# Patient Record
Sex: Male | Born: 1945 | Race: Black or African American | Hispanic: No | Marital: Married | Smoking: Never smoker
Health system: Southern US, Community
[De-identification: ages and names within clinical notes are randomized; demographics above are authoritative.]

## PROBLEM LIST (undated history)

## (undated) DIAGNOSIS — I10 Essential (primary) hypertension: Secondary | ICD-10-CM

## (undated) DIAGNOSIS — I4891 Unspecified atrial fibrillation: Secondary | ICD-10-CM

## (undated) DIAGNOSIS — K922 Gastrointestinal hemorrhage, unspecified: Secondary | ICD-10-CM

## (undated) DIAGNOSIS — N189 Chronic kidney disease, unspecified: Secondary | ICD-10-CM

## (undated) DIAGNOSIS — E079 Disorder of thyroid, unspecified: Secondary | ICD-10-CM

## (undated) HISTORY — DX: Chronic kidney disease, unspecified: N18.9

## (undated) HISTORY — DX: Essential (primary) hypertension: I10

## (undated) HISTORY — PX: CARDIAC DEFIBRILLATOR PLACEMENT: SHX171

## (undated) HISTORY — DX: Gastrointestinal hemorrhage, unspecified: K92.2

## (undated) HISTORY — DX: Disorder of thyroid, unspecified: E07.9

## (undated) HISTORY — DX: Unspecified atrial fibrillation: I48.91

---

## 2008-09-28 ENCOUNTER — Emergency Department: Payer: Self-pay | Admitting: Emergency Medicine

## 2010-03-16 ENCOUNTER — Ambulatory Visit: Payer: Self-pay | Admitting: Family Medicine

## 2010-05-29 ENCOUNTER — Ambulatory Visit: Payer: Self-pay | Admitting: Urology

## 2011-11-16 ENCOUNTER — Other Ambulatory Visit: Payer: Self-pay | Admitting: Family Medicine

## 2011-11-16 LAB — PROTIME-INR
INR: 2
Prothrombin Time: 22.8 secs — ABNORMAL HIGH (ref 11.5–14.7)

## 2012-01-31 ENCOUNTER — Ambulatory Visit: Payer: Self-pay | Admitting: Cardiology

## 2012-01-31 LAB — BASIC METABOLIC PANEL
Anion Gap: 8 (ref 7–16)
BUN: 45 mg/dL — ABNORMAL HIGH (ref 7–18)
Calcium, Total: 9.2 mg/dL (ref 8.5–10.1)
Chloride: 98 mmol/L (ref 98–107)
Co2: 25 mmol/L (ref 21–32)
Creatinine: 2.31 mg/dL — ABNORMAL HIGH (ref 0.60–1.30)
EGFR (African American): 33 — ABNORMAL LOW
EGFR (Non-African Amer.): 28 — ABNORMAL LOW
Osmolality: 275 (ref 275–301)

## 2012-01-31 LAB — PHOSPHORUS: Phosphorus: 3 mg/dL (ref 2.5–4.9)

## 2012-02-14 ENCOUNTER — Ambulatory Visit: Payer: Self-pay | Admitting: Cardiology

## 2012-02-14 LAB — BASIC METABOLIC PANEL
Anion Gap: 7 (ref 7–16)
Calcium, Total: 8.7 mg/dL (ref 8.5–10.1)
Chloride: 102 mmol/L (ref 98–107)
Co2: 25 mmol/L (ref 21–32)
Creatinine: 1.9 mg/dL — ABNORMAL HIGH (ref 0.60–1.30)
EGFR (African American): 41 — ABNORMAL LOW
Potassium: 4.2 mmol/L (ref 3.5–5.1)

## 2012-02-14 LAB — CBC WITH DIFFERENTIAL/PLATELET
Comment - H1-Com6: NORMAL
HCT: 21.3 % — ABNORMAL LOW (ref 40.0–52.0)
HGB: 6.8 g/dL — ABNORMAL LOW (ref 13.0–18.0)
Lymphocytes: 21 %
MCH: 25.3 pg — ABNORMAL LOW (ref 26.0–34.0)
MCHC: 32 g/dL (ref 32.0–36.0)
MCV: 79 fL — ABNORMAL LOW (ref 80–100)
Monocytes: 5 %
Platelet: 181 10*3/uL (ref 150–440)
RDW: 20.3 % — ABNORMAL HIGH (ref 11.5–14.5)
Segmented Neutrophils: 64 %
WBC: 6.3 10*3/uL (ref 3.8–10.6)

## 2012-02-14 LAB — PHOSPHORUS: Phosphorus: 3.1 mg/dL (ref 2.5–4.9)

## 2012-02-24 ENCOUNTER — Ambulatory Visit: Payer: Self-pay | Admitting: Cardiology

## 2012-02-24 LAB — CBC WITH DIFFERENTIAL/PLATELET
Basophil %: 0.6 %
Eosinophil #: 0.3 10*3/uL (ref 0.0–0.7)
Lymphocyte %: 16.2 %
MCH: 29.2 pg (ref 26.0–34.0)
MCHC: 33.5 g/dL (ref 32.0–36.0)
MCV: 87 fL (ref 80–100)
Monocyte #: 0.9 x10 3/mm (ref 0.2–1.0)
Monocyte %: 12.2 %
Neutrophil #: 4.8 10*3/uL (ref 1.4–6.5)
Neutrophil %: 67.1 %
RDW: 17.5 % — ABNORMAL HIGH (ref 11.5–14.5)

## 2012-03-13 ENCOUNTER — Ambulatory Visit: Payer: Self-pay | Admitting: Cardiology

## 2012-03-13 LAB — CBC WITH DIFFERENTIAL/PLATELET
Basophil #: 0 10*3/uL (ref 0.0–0.1)
HCT: 29.5 % — ABNORMAL LOW (ref 40.0–52.0)
HGB: 9.6 g/dL — ABNORMAL LOW (ref 13.0–18.0)
Lymphocyte #: 1.1 10*3/uL (ref 1.0–3.6)
MCV: 85 fL (ref 80–100)
Monocyte #: 0.9 x10 3/mm (ref 0.2–1.0)
Monocyte %: 16.5 %
Neutrophil #: 3.4 10*3/uL (ref 1.4–6.5)
Neutrophil %: 60.4 %
RDW: 15.2 % — ABNORMAL HIGH (ref 11.5–14.5)

## 2012-03-13 LAB — PROTIME-INR
INR: 1
Prothrombin Time: 13.9 secs (ref 11.5–14.7)

## 2012-03-17 ENCOUNTER — Ambulatory Visit: Payer: Self-pay | Admitting: Cardiology

## 2012-03-17 LAB — CBC WITH DIFFERENTIAL/PLATELET
Basophil #: 0 10*3/uL (ref 0.0–0.1)
Basophil %: 0.6 %
Eosinophil #: 0.2 10*3/uL (ref 0.0–0.7)
HCT: 29.5 % — ABNORMAL LOW (ref 40.0–52.0)
HGB: 9.4 g/dL — ABNORMAL LOW (ref 13.0–18.0)
Lymphocyte #: 1.7 10*3/uL (ref 1.0–3.6)
Lymphocyte %: 29 %
MCH: 27 pg (ref 26.0–34.0)
MCHC: 31.8 g/dL — ABNORMAL LOW (ref 32.0–36.0)
MCV: 85 fL (ref 80–100)
Monocyte %: 15 %
Neutrophil %: 51.6 %
Platelet: 209 10*3/uL (ref 150–440)
RBC: 3.47 10*6/uL — ABNORMAL LOW (ref 4.40–5.90)
WBC: 5.9 10*3/uL (ref 3.8–10.6)

## 2012-03-17 LAB — PROTIME-INR
INR: 1
Prothrombin Time: 13.9 secs (ref 11.5–14.7)

## 2012-03-23 ENCOUNTER — Ambulatory Visit: Payer: Self-pay | Admitting: Cardiology

## 2012-03-23 LAB — CBC WITH DIFFERENTIAL/PLATELET
Basophil #: 0 10*3/uL (ref 0.0–0.1)
Basophil %: 0.7 %
Eosinophil #: 0.2 10*3/uL (ref 0.0–0.7)
Eosinophil %: 3.8 %
HCT: 29.3 % — ABNORMAL LOW (ref 40.0–52.0)
HGB: 9.6 g/dL — ABNORMAL LOW (ref 13.0–18.0)
Lymphocyte #: 1 10*3/uL (ref 1.0–3.6)
MCH: 27.5 pg (ref 26.0–34.0)
MCHC: 32.8 g/dL (ref 32.0–36.0)
Monocyte #: 0.6 x10 3/mm (ref 0.2–1.0)
RDW: 14.5 % (ref 11.5–14.5)
WBC: 5.6 10*3/uL (ref 3.8–10.6)

## 2012-03-23 LAB — PROTIME-INR: INR: 1.1

## 2012-03-31 ENCOUNTER — Ambulatory Visit: Payer: Self-pay | Admitting: Cardiology

## 2012-03-31 LAB — PROTIME-INR
INR: 1.4
Prothrombin Time: 17.2 secs — ABNORMAL HIGH (ref 11.5–14.7)

## 2012-04-08 ENCOUNTER — Ambulatory Visit: Payer: Self-pay | Admitting: Cardiology

## 2012-04-08 LAB — PROTIME-INR: Prothrombin Time: 27.3 secs — ABNORMAL HIGH (ref 11.5–14.7)

## 2012-04-16 ENCOUNTER — Ambulatory Visit: Payer: Self-pay | Admitting: Cardiology

## 2012-04-16 LAB — PROTIME-INR
INR: 1.8
Prothrombin Time: 20.8 secs — ABNORMAL HIGH (ref 11.5–14.7)

## 2012-04-16 LAB — CBC WITH DIFFERENTIAL/PLATELET
Basophil %: 0.6 %
Eosinophil #: 0.2 10*3/uL (ref 0.0–0.7)
HCT: 28.4 % — ABNORMAL LOW (ref 40.0–52.0)
HGB: 9 g/dL — ABNORMAL LOW (ref 13.0–18.0)
Lymphocyte #: 0.9 10*3/uL — ABNORMAL LOW (ref 1.0–3.6)
MCH: 25.9 pg — ABNORMAL LOW (ref 26.0–34.0)
MCHC: 31.8 g/dL — ABNORMAL LOW (ref 32.0–36.0)
MCV: 81 fL (ref 80–100)
Neutrophil %: 61.6 %
Platelet: 188 10*3/uL (ref 150–440)
RDW: 13.9 % (ref 11.5–14.5)
WBC: 5.2 10*3/uL (ref 3.8–10.6)

## 2012-05-05 ENCOUNTER — Ambulatory Visit: Payer: Self-pay | Admitting: Cardiology

## 2012-05-05 LAB — CBC WITH DIFFERENTIAL/PLATELET
Basophil %: 0.6 %
Eosinophil #: 0.2 10*3/uL (ref 0.0–0.7)
Eosinophil %: 4.2 %
HCT: 21.6 % — ABNORMAL LOW (ref 40.0–52.0)
Lymphocyte #: 1.2 10*3/uL (ref 1.0–3.6)
Lymphocyte %: 21.2 %
MCH: 26 pg (ref 26.0–34.0)
MCV: 81 fL (ref 80–100)
Monocyte #: 0.8 x10 3/mm (ref 0.2–1.0)
Monocyte %: 14.5 %
Neutrophil %: 59.5 %
Platelet: 202 10*3/uL (ref 150–440)
RBC: 2.66 10*6/uL — ABNORMAL LOW (ref 4.40–5.90)
RDW: 15.1 % — ABNORMAL HIGH (ref 11.5–14.5)
WBC: 5.6 10*3/uL (ref 3.8–10.6)

## 2012-05-05 LAB — PROTIME-INR
INR: 1.9
Prothrombin Time: 22.1 secs — ABNORMAL HIGH (ref 11.5–14.7)

## 2012-05-05 LAB — BASIC METABOLIC PANEL
BUN: 35 mg/dL — ABNORMAL HIGH (ref 7–18)
Calcium, Total: 8.7 mg/dL (ref 8.5–10.1)
Co2: 24 mmol/L (ref 21–32)
Creatinine: 2.56 mg/dL — ABNORMAL HIGH (ref 0.60–1.30)
EGFR (African American): 29 — ABNORMAL LOW
EGFR (Non-African Amer.): 25 — ABNORMAL LOW
Glucose: 99 mg/dL (ref 65–99)
Potassium: 4.2 mmol/L (ref 3.5–5.1)
Sodium: 135 mmol/L — ABNORMAL LOW (ref 136–145)

## 2012-05-25 ENCOUNTER — Ambulatory Visit: Payer: Self-pay | Admitting: Cardiology

## 2012-05-25 LAB — BASIC METABOLIC PANEL
Anion Gap: 8 (ref 7–16)
BUN: 38 mg/dL — ABNORMAL HIGH (ref 7–18)
Calcium, Total: 8.7 mg/dL (ref 8.5–10.1)
EGFR (African American): 28 — ABNORMAL LOW
EGFR (Non-African Amer.): 24 — ABNORMAL LOW
Glucose: 123 mg/dL — ABNORMAL HIGH (ref 65–99)
Osmolality: 279 (ref 275–301)
Potassium: 4.3 mmol/L (ref 3.5–5.1)

## 2012-05-25 LAB — CBC WITH DIFFERENTIAL/PLATELET
Basophil #: 0 10*3/uL (ref 0.0–0.1)
HCT: 24.5 % — ABNORMAL LOW (ref 40.0–52.0)
HGB: 8 g/dL — ABNORMAL LOW (ref 13.0–18.0)
Lymphocyte %: 17 %
Monocyte %: 12.8 %
Neutrophil #: 4.1 10*3/uL (ref 1.4–6.5)
Platelet: 254 10*3/uL (ref 150–440)
RDW: 14.8 % — ABNORMAL HIGH (ref 11.5–14.5)
WBC: 6.2 10*3/uL (ref 3.8–10.6)

## 2012-05-25 LAB — MAGNESIUM: Magnesium: 2.3 mg/dL

## 2012-05-25 LAB — PROTIME-INR: Prothrombin Time: 13.2 secs (ref 11.5–14.7)

## 2012-06-08 ENCOUNTER — Ambulatory Visit: Payer: Self-pay | Admitting: Cardiology

## 2012-06-08 LAB — CBC WITH DIFFERENTIAL/PLATELET
Eosinophil #: 0.2 10*3/uL (ref 0.0–0.7)
Eosinophil %: 3.4 %
HGB: 7.1 g/dL — ABNORMAL LOW (ref 13.0–18.0)
Lymphocyte #: 0.8 10*3/uL — ABNORMAL LOW (ref 1.0–3.6)
MCHC: 32.1 g/dL (ref 32.0–36.0)
MCV: 86 fL (ref 80–100)
Monocyte %: 13.3 %
Neutrophil #: 3.5 10*3/uL (ref 1.4–6.5)
Neutrophil %: 68.1 %
Platelet: 221 10*3/uL (ref 150–440)
RBC: 2.56 10*6/uL — ABNORMAL LOW (ref 4.40–5.90)
RDW: 15.4 % — ABNORMAL HIGH (ref 11.5–14.5)
WBC: 5.1 10*3/uL (ref 3.8–10.6)

## 2012-06-08 LAB — BASIC METABOLIC PANEL
Anion Gap: 5 — ABNORMAL LOW (ref 7–16)
BUN: 29 mg/dL — ABNORMAL HIGH (ref 7–18)
Co2: 26 mmol/L (ref 21–32)
Creatinine: 2.41 mg/dL — ABNORMAL HIGH (ref 0.60–1.30)
EGFR (African American): 31 — ABNORMAL LOW
EGFR (Non-African Amer.): 27 — ABNORMAL LOW
Glucose: 100 mg/dL — ABNORMAL HIGH (ref 65–99)
Potassium: 4.1 mmol/L (ref 3.5–5.1)

## 2012-06-08 LAB — PHOSPHORUS: Phosphorus: 3.6 mg/dL (ref 2.5–4.9)

## 2012-06-08 LAB — MAGNESIUM: Magnesium: 1.8 mg/dL

## 2012-06-17 ENCOUNTER — Ambulatory Visit: Payer: Self-pay | Admitting: Cardiology

## 2012-06-17 LAB — CBC WITH DIFFERENTIAL/PLATELET
Basophil #: 0 10*3/uL (ref 0.0–0.1)
Eosinophil #: 0.2 10*3/uL (ref 0.0–0.7)
Eosinophil %: 4 %
Lymphocyte #: 0.8 10*3/uL — ABNORMAL LOW (ref 1.0–3.6)
MCH: 27.6 pg (ref 26.0–34.0)
MCHC: 32 g/dL (ref 32.0–36.0)
MCV: 86 fL (ref 80–100)
Monocyte #: 0.6 x10 3/mm (ref 0.2–1.0)
Neutrophil %: 63.5 %
Platelet: 200 10*3/uL (ref 150–440)
RBC: 3.47 10*6/uL — ABNORMAL LOW (ref 4.40–5.90)
RDW: 14.1 % (ref 11.5–14.5)

## 2012-06-24 ENCOUNTER — Ambulatory Visit: Payer: Self-pay | Admitting: Cardiology

## 2012-06-24 LAB — CBC WITH DIFFERENTIAL/PLATELET
Basophil #: 0 10*3/uL (ref 0.0–0.1)
Eosinophil #: 0.2 10*3/uL (ref 0.0–0.7)
HCT: 31.2 % — ABNORMAL LOW (ref 40.0–52.0)
Lymphocyte %: 18.4 %
MCHC: 31.9 g/dL — ABNORMAL LOW (ref 32.0–36.0)
Monocyte %: 11.8 %
Neutrophil #: 3.3 10*3/uL (ref 1.4–6.5)
Neutrophil %: 65.2 %
RBC: 3.7 10*6/uL — ABNORMAL LOW (ref 4.40–5.90)
RDW: 14.2 % (ref 11.5–14.5)
WBC: 5 10*3/uL (ref 3.8–10.6)

## 2012-06-24 LAB — BASIC METABOLIC PANEL
Anion Gap: 4 — ABNORMAL LOW (ref 7–16)
BUN: 25 mg/dL — ABNORMAL HIGH (ref 7–18)
Chloride: 104 mmol/L (ref 98–107)
EGFR (Non-African Amer.): 23 — ABNORMAL LOW
Glucose: 125 mg/dL — ABNORMAL HIGH (ref 65–99)
Osmolality: 278 (ref 275–301)

## 2012-06-24 LAB — MAGNESIUM: Magnesium: 1.7 mg/dL — ABNORMAL LOW

## 2012-06-24 LAB — PHOSPHORUS: Phosphorus: 3.2 mg/dL (ref 2.5–4.9)

## 2012-07-15 ENCOUNTER — Ambulatory Visit: Payer: Self-pay | Admitting: Cardiology

## 2012-07-15 LAB — BASIC METABOLIC PANEL
BUN: 21 mg/dL — ABNORMAL HIGH (ref 7–18)
Calcium, Total: 9.2 mg/dL (ref 8.5–10.1)
Co2: 30 mmol/L (ref 21–32)
EGFR (Non-African Amer.): 27 — ABNORMAL LOW
Glucose: 130 mg/dL — ABNORMAL HIGH (ref 65–99)
Potassium: 4.4 mmol/L (ref 3.5–5.1)
Sodium: 138 mmol/L (ref 136–145)

## 2012-07-15 LAB — CBC WITH DIFFERENTIAL/PLATELET
Eosinophil #: 0.2 10*3/uL (ref 0.0–0.7)
Lymphocyte #: 1 10*3/uL (ref 1.0–3.6)
MCH: 25.8 pg — ABNORMAL LOW (ref 26.0–34.0)
MCHC: 31.7 g/dL — ABNORMAL LOW (ref 32.0–36.0)
MCV: 82 fL (ref 80–100)
Monocyte #: 0.7 x10 3/mm (ref 0.2–1.0)
Neutrophil %: 57.4 %
Platelet: 161 10*3/uL (ref 150–440)
RBC: 4.02 10*6/uL — ABNORMAL LOW (ref 4.40–5.90)
RDW: 13.7 % (ref 11.5–14.5)
WBC: 4.5 10*3/uL (ref 3.8–10.6)

## 2012-07-15 LAB — MAGNESIUM: Magnesium: 1.6 mg/dL — ABNORMAL LOW

## 2012-08-12 ENCOUNTER — Ambulatory Visit: Payer: Self-pay | Admitting: Cardiology

## 2012-08-12 LAB — CBC WITH DIFFERENTIAL/PLATELET
Basophil %: 0.8 %
HGB: 11.1 g/dL — ABNORMAL LOW (ref 13.0–18.0)
MCH: 24.6 pg — ABNORMAL LOW (ref 26.0–34.0)
MCHC: 31 g/dL — ABNORMAL LOW (ref 32.0–36.0)
MCV: 80 fL (ref 80–100)
Monocyte #: 0.9 x10 3/mm (ref 0.2–1.0)
Neutrophil #: 2 10*3/uL (ref 1.4–6.5)
Neutrophil %: 45.1 %
RDW: 13.5 % (ref 11.5–14.5)

## 2012-08-12 LAB — BASIC METABOLIC PANEL
Anion Gap: 3 — ABNORMAL LOW (ref 7–16)
Calcium, Total: 9 mg/dL (ref 8.5–10.1)
Chloride: 101 mmol/L (ref 98–107)
Co2: 30 mmol/L (ref 21–32)
Creatinine: 2.61 mg/dL — ABNORMAL HIGH (ref 0.60–1.30)
EGFR (African American): 28 — ABNORMAL LOW
Osmolality: 273 (ref 275–301)

## 2012-08-12 LAB — MAGNESIUM: Magnesium: 2.1 mg/dL

## 2012-08-12 LAB — PHOSPHORUS: Phosphorus: 3.5 mg/dL (ref 2.5–4.9)

## 2012-08-27 ENCOUNTER — Ambulatory Visit: Payer: Self-pay | Admitting: Cardiology

## 2012-08-27 LAB — BASIC METABOLIC PANEL
Anion Gap: 5 — ABNORMAL LOW (ref 7–16)
BUN: 26 mg/dL — ABNORMAL HIGH (ref 7–18)
Chloride: 104 mmol/L (ref 98–107)
Creatinine: 2.38 mg/dL — ABNORMAL HIGH (ref 0.60–1.30)
EGFR (African American): 31 — ABNORMAL LOW
Potassium: 4.8 mmol/L (ref 3.5–5.1)

## 2012-08-27 LAB — CBC WITH DIFFERENTIAL/PLATELET
Basophil #: 0 10*3/uL (ref 0.0–0.1)
Basophil %: 0.8 %
Eosinophil #: 0.2 10*3/uL (ref 0.0–0.7)
HCT: 33.7 % — ABNORMAL LOW (ref 40.0–52.0)
HGB: 10.5 g/dL — ABNORMAL LOW (ref 13.0–18.0)
Lymphocyte %: 22.5 %
MCH: 24.4 pg — ABNORMAL LOW (ref 26.0–34.0)
MCHC: 31.3 g/dL — ABNORMAL LOW (ref 32.0–36.0)
MCV: 78 fL — ABNORMAL LOW (ref 80–100)
Monocyte %: 20.5 %
Neutrophil %: 50.8 %
RDW: 13.7 % (ref 11.5–14.5)

## 2012-08-27 LAB — MAGNESIUM: Magnesium: 1.8 mg/dL

## 2012-08-27 LAB — PHOSPHORUS: Phosphorus: 2.7 mg/dL (ref 2.5–4.9)

## 2012-10-08 ENCOUNTER — Ambulatory Visit: Payer: Self-pay | Admitting: Cardiology

## 2012-10-08 LAB — CBC WITH DIFFERENTIAL/PLATELET
Basophil #: 0 10*3/uL (ref 0.0–0.1)
Basophil %: 0.7 %
Eosinophil #: 0.2 10*3/uL (ref 0.0–0.7)
Lymphocyte #: 0.9 10*3/uL — ABNORMAL LOW (ref 1.0–3.6)
MCH: 23.4 pg — ABNORMAL LOW (ref 26.0–34.0)
MCHC: 31.4 g/dL — ABNORMAL LOW (ref 32.0–36.0)
MCV: 75 fL — ABNORMAL LOW (ref 80–100)
Monocyte #: 0.6 x10 3/mm (ref 0.2–1.0)
Neutrophil %: 45.8 %
Platelet: 164 10*3/uL (ref 150–440)

## 2012-10-08 LAB — BASIC METABOLIC PANEL
Anion Gap: 8 (ref 7–16)
Chloride: 103 mmol/L (ref 98–107)
Co2: 27 mmol/L (ref 21–32)
Creatinine: 2.42 mg/dL — ABNORMAL HIGH (ref 0.60–1.30)
EGFR (African American): 31 — ABNORMAL LOW
Glucose: 104 mg/dL — ABNORMAL HIGH (ref 65–99)

## 2012-10-08 LAB — LIPID PANEL: Cholesterol: 203 mg/dL — ABNORMAL HIGH (ref 0–200)

## 2012-10-08 LAB — MAGNESIUM: Magnesium: 1.7 mg/dL — ABNORMAL LOW

## 2012-10-08 LAB — PHOSPHORUS: Phosphorus: 3.5 mg/dL (ref 2.5–4.9)

## 2012-10-22 ENCOUNTER — Ambulatory Visit: Payer: Self-pay | Admitting: Cardiology

## 2012-10-22 LAB — MAGNESIUM: Magnesium: 1.6 mg/dL — ABNORMAL LOW

## 2012-10-22 LAB — BASIC METABOLIC PANEL
Calcium, Total: 9 mg/dL (ref 8.5–10.1)
Chloride: 104 mmol/L (ref 98–107)
EGFR (African American): 32 — ABNORMAL LOW
EGFR (Non-African Amer.): 28 — ABNORMAL LOW
Osmolality: 277 (ref 275–301)
Potassium: 4.5 mmol/L (ref 3.5–5.1)
Sodium: 137 mmol/L (ref 136–145)

## 2012-10-22 LAB — PROTIME-INR: Prothrombin Time: 13 secs (ref 11.5–14.7)

## 2012-10-22 LAB — PHOSPHORUS: Phosphorus: 3 mg/dL (ref 2.5–4.9)

## 2012-12-09 ENCOUNTER — Ambulatory Visit: Payer: Self-pay | Admitting: Cardiology

## 2012-12-09 LAB — CBC WITH DIFFERENTIAL/PLATELET
Eosinophil %: 7.2 %
Lymphocyte #: 0.7 10*3/uL — ABNORMAL LOW (ref 1.0–3.6)
Lymphocyte %: 25.4 %
MCHC: 31 g/dL — ABNORMAL LOW (ref 32.0–36.0)
MCV: 75 fL — ABNORMAL LOW (ref 80–100)
Monocyte %: 20.4 %
Neutrophil #: 1.2 10*3/uL — ABNORMAL LOW (ref 1.4–6.5)
Neutrophil %: 45.9 %
Platelet: 156 10*3/uL (ref 150–440)
RBC: 4.25 10*6/uL — ABNORMAL LOW (ref 4.40–5.90)
RDW: 14.2 % (ref 11.5–14.5)

## 2012-12-09 LAB — DIGOXIN LEVEL: Digoxin: 0.48 ng/mL

## 2012-12-09 LAB — BASIC METABOLIC PANEL
Anion Gap: 9 (ref 7–16)
BUN: 26 mg/dL — ABNORMAL HIGH (ref 7–18)
Calcium, Total: 9 mg/dL (ref 8.5–10.1)
Co2: 27 mmol/L (ref 21–32)
EGFR (African American): 31 — ABNORMAL LOW
Osmolality: 286 (ref 275–301)
Potassium: 4.5 mmol/L (ref 3.5–5.1)
Sodium: 141 mmol/L (ref 136–145)

## 2012-12-09 LAB — PHOSPHORUS: Phosphorus: 2.9 mg/dL (ref 2.5–4.9)

## 2012-12-24 ENCOUNTER — Ambulatory Visit: Payer: Self-pay | Admitting: Cardiology

## 2012-12-24 LAB — CBC WITH DIFFERENTIAL/PLATELET
Basophil #: 0 10*3/uL (ref 0.0–0.1)
Basophil %: 0.7 %
HCT: 32.8 % — ABNORMAL LOW (ref 40.0–52.0)
HGB: 10.3 g/dL — ABNORMAL LOW (ref 13.0–18.0)
Lymphocyte %: 25.4 %
MCH: 23.5 pg — ABNORMAL LOW (ref 26.0–34.0)
MCV: 75 fL — ABNORMAL LOW (ref 80–100)
Monocyte %: 18.8 %
Neutrophil #: 1.5 10*3/uL (ref 1.4–6.5)
Neutrophil %: 48.2 %
Platelet: 136 10*3/uL — ABNORMAL LOW (ref 150–440)
RBC: 4.36 10*6/uL — ABNORMAL LOW (ref 4.40–5.90)
WBC: 3.2 10*3/uL — ABNORMAL LOW (ref 3.8–10.6)

## 2012-12-24 LAB — BASIC METABOLIC PANEL
Anion Gap: 8 (ref 7–16)
BUN: 26 mg/dL — ABNORMAL HIGH (ref 7–18)
Chloride: 103 mmol/L (ref 98–107)
Co2: 28 mmol/L (ref 21–32)
Creatinine: 2.54 mg/dL — ABNORMAL HIGH (ref 0.60–1.30)
EGFR (African American): 29 — ABNORMAL LOW
Glucose: 124 mg/dL — ABNORMAL HIGH (ref 65–99)
Osmolality: 284 (ref 275–301)
Potassium: 4.5 mmol/L (ref 3.5–5.1)
Sodium: 139 mmol/L (ref 136–145)

## 2012-12-24 LAB — PHOSPHORUS: Phosphorus: 3 mg/dL (ref 2.5–4.9)

## 2013-02-19 ENCOUNTER — Ambulatory Visit: Payer: Self-pay | Admitting: Cardiology

## 2013-02-19 LAB — POTASSIUM: Potassium: 4.2 mmol/L (ref 3.5–5.1)

## 2013-02-19 LAB — CBC WITH DIFFERENTIAL/PLATELET
Basophil #: 0.1 10*3/uL (ref 0.0–0.1)
Basophil %: 1.6 %
Eosinophil #: 0.2 10*3/uL (ref 0.0–0.7)
Eosinophil %: 6.4 %
HCT: 34.1 % — ABNORMAL LOW (ref 40.0–52.0)
Lymphocyte #: 1 10*3/uL (ref 1.0–3.6)
MCH: 22.7 pg — ABNORMAL LOW (ref 26.0–34.0)
MCHC: 30.5 g/dL — ABNORMAL LOW (ref 32.0–36.0)
MCV: 74 fL — ABNORMAL LOW (ref 80–100)
Monocyte #: 0.7 x10 3/mm (ref 0.2–1.0)
Monocyte %: 21.5 %
Neutrophil #: 1.3 10*3/uL — ABNORMAL LOW (ref 1.4–6.5)
Neutrophil %: 39.4 %
Platelet: 146 10*3/uL — ABNORMAL LOW (ref 150–440)
RBC: 4.58 10*6/uL (ref 4.40–5.90)
WBC: 3.3 10*3/uL — ABNORMAL LOW (ref 3.8–10.6)

## 2013-02-19 LAB — CREATININE, SERUM: Creatinine: 2.52 mg/dL — ABNORMAL HIGH (ref 0.60–1.30)

## 2013-02-19 LAB — CHLORIDE: Chloride: 103 mmol/L (ref 98–107)

## 2013-02-19 LAB — GLUCOSE, RANDOM: Glucose: 99 mg/dL (ref 65–99)

## 2013-02-19 LAB — BUN: BUN: 24 mg/dL — ABNORMAL HIGH (ref 7–18)

## 2013-02-19 LAB — CO2, TOTAL: Co2: 30 mmol/L (ref 21–32)

## 2013-02-19 LAB — SODIUM: Sodium: 138 mmol/L (ref 136–145)

## 2013-04-20 ENCOUNTER — Ambulatory Visit: Payer: Self-pay | Admitting: Cardiology

## 2013-04-20 LAB — ELECTROLYTE PANEL
Anion Gap: 8 (ref 7–16)
Co2: 26 mmol/L (ref 21–32)
Potassium: 4.5 mmol/L (ref 3.5–5.1)
Sodium: 138 mmol/L (ref 136–145)

## 2013-04-20 LAB — CBC WITH DIFFERENTIAL/PLATELET
Basophil #: 0 10*3/uL (ref 0.0–0.1)
Basophil %: 1 %
Eosinophil #: 0.2 10*3/uL (ref 0.0–0.7)
Eosinophil %: 4.6 %
HCT: 30.2 % — ABNORMAL LOW (ref 40.0–52.0)
Lymphocyte #: 0.9 10*3/uL — ABNORMAL LOW (ref 1.0–3.6)
Lymphocyte %: 28.4 %
MCH: 22.9 pg — ABNORMAL LOW (ref 26.0–34.0)
MCHC: 31.2 g/dL — ABNORMAL LOW (ref 32.0–36.0)
MCV: 74 fL — ABNORMAL LOW (ref 80–100)
Monocyte #: 0.6 x10 3/mm (ref 0.2–1.0)
Neutrophil %: 46.3 %
RDW: 14.5 % (ref 11.5–14.5)

## 2013-04-20 LAB — BUN: BUN: 25 mg/dL — ABNORMAL HIGH (ref 7–18)

## 2013-04-20 LAB — GLUCOSE, RANDOM: Glucose: 89 mg/dL (ref 65–99)

## 2013-04-20 LAB — CREATININE, SERUM
Creatinine: 2.54 mg/dL — ABNORMAL HIGH (ref 0.60–1.30)
EGFR (African American): 29 — ABNORMAL LOW
EGFR (Non-African Amer.): 25 — ABNORMAL LOW

## 2013-05-05 ENCOUNTER — Ambulatory Visit: Payer: Self-pay | Admitting: Cardiology

## 2013-05-05 LAB — CBC WITH DIFFERENTIAL/PLATELET
Basophil #: 0 10*3/uL (ref 0.0–0.1)
Eosinophil #: 0.2 10*3/uL (ref 0.0–0.7)
HCT: 32.7 % — ABNORMAL LOW (ref 40.0–52.0)
Lymphocyte %: 28.9 %
MCH: 23.5 pg — ABNORMAL LOW (ref 26.0–34.0)
MCHC: 31.2 g/dL — ABNORMAL LOW (ref 32.0–36.0)
MCV: 75 fL — ABNORMAL LOW (ref 80–100)
Monocyte #: 0.6 x10 3/mm (ref 0.2–1.0)
Monocyte %: 18.5 %
Neutrophil %: 47.2 %
Platelet: 169 10*3/uL (ref 150–440)
RBC: 4.35 10*6/uL — ABNORMAL LOW (ref 4.40–5.90)
RDW: 14.5 % (ref 11.5–14.5)
WBC: 3.4 10*3/uL — ABNORMAL LOW (ref 3.8–10.6)

## 2013-06-03 ENCOUNTER — Ambulatory Visit: Payer: Self-pay | Admitting: Cardiology

## 2013-06-03 LAB — CBC WITH DIFFERENTIAL/PLATELET
Basophil #: 0 10*3/uL (ref 0.0–0.1)
Basophil %: 0.6 %
Eosinophil %: 4.9 %
HCT: 33.2 % — ABNORMAL LOW (ref 40.0–52.0)
Lymphocyte #: 0.9 10*3/uL — ABNORMAL LOW (ref 1.0–3.6)
MCH: 23.6 pg — ABNORMAL LOW (ref 26.0–34.0)
MCHC: 31.4 g/dL — ABNORMAL LOW (ref 32.0–36.0)
MCV: 75 fL — ABNORMAL LOW (ref 80–100)
Monocyte #: 0.8 x10 3/mm (ref 0.2–1.0)
Monocyte %: 23.7 %
Neutrophil #: 1.5 10*3/uL (ref 1.4–6.5)
Neutrophil %: 44.2 %
Platelet: 157 10*3/uL (ref 150–440)
RBC: 4.41 10*6/uL (ref 4.40–5.90)
RDW: 14.1 % (ref 11.5–14.5)
WBC: 3.3 10*3/uL — ABNORMAL LOW (ref 3.8–10.6)

## 2013-06-03 LAB — BASIC METABOLIC PANEL
Anion Gap: 8 (ref 7–16)
BUN: 23 mg/dL — ABNORMAL HIGH (ref 7–18)
Co2: 28 mmol/L (ref 21–32)
Creatinine: 2.56 mg/dL — ABNORMAL HIGH (ref 0.60–1.30)
EGFR (Non-African Amer.): 25 — ABNORMAL LOW
Glucose: 100 mg/dL — ABNORMAL HIGH (ref 65–99)
Osmolality: 279 (ref 275–301)
Sodium: 138 mmol/L (ref 136–145)

## 2013-06-03 LAB — MAGNESIUM: Magnesium: 1.9 mg/dL

## 2013-07-02 ENCOUNTER — Ambulatory Visit: Payer: Self-pay | Admitting: Cardiology

## 2013-07-02 LAB — CBC WITH DIFFERENTIAL/PLATELET
Basophil #: 0 10*3/uL (ref 0.0–0.1)
Basophil %: 0.5 %
Eosinophil %: 3.2 %
HCT: 28.4 % — ABNORMAL LOW (ref 40.0–52.0)
Lymphocyte #: 1.2 10*3/uL (ref 1.0–3.6)
Lymphocyte %: 17.4 %
MCHC: 33.2 g/dL (ref 32.0–36.0)
Monocyte #: 0.8 x10 3/mm (ref 0.2–1.0)
Monocyte %: 12 %
Neutrophil #: 4.6 10*3/uL (ref 1.4–6.5)
Platelet: 187 10*3/uL (ref 150–440)
RBC: 3.53 10*6/uL — ABNORMAL LOW (ref 4.40–5.90)

## 2013-07-08 ENCOUNTER — Ambulatory Visit: Payer: Self-pay | Admitting: Cardiology

## 2013-07-08 LAB — CBC WITH DIFFERENTIAL/PLATELET
Basophil #: 0 10*3/uL (ref 0.0–0.1)
Eosinophil #: 0.2 10*3/uL (ref 0.0–0.7)
HGB: 8.3 g/dL — ABNORMAL LOW (ref 13.0–18.0)
Lymphocyte #: 1.2 10*3/uL (ref 1.0–3.6)
MCH: 26.8 pg (ref 26.0–34.0)
Neutrophil #: 4.6 10*3/uL (ref 1.4–6.5)
Platelet: 189 10*3/uL (ref 150–440)
RBC: 3.1 10*6/uL — ABNORMAL LOW (ref 4.40–5.90)
WBC: 6.8 10*3/uL (ref 3.8–10.6)

## 2013-07-15 ENCOUNTER — Ambulatory Visit: Payer: Self-pay | Admitting: Cardiology

## 2013-07-15 LAB — CBC WITH DIFFERENTIAL/PLATELET
Eosinophil #: 0.1 10*3/uL (ref 0.0–0.7)
HGB: 7.2 g/dL — ABNORMAL LOW (ref 13.0–18.0)
Lymphocyte #: 1.2 10*3/uL (ref 1.0–3.6)
Lymphocyte %: 17.6 %
MCHC: 31.8 g/dL — ABNORMAL LOW (ref 32.0–36.0)
MCV: 85 fL (ref 80–100)
Monocyte #: 0.7 x10 3/mm (ref 0.2–1.0)
Monocyte %: 11.3 %
Neutrophil #: 4.5 10*3/uL (ref 1.4–6.5)
Platelet: 200 10*3/uL (ref 150–440)
RBC: 2.69 10*6/uL — ABNORMAL LOW (ref 4.40–5.90)
WBC: 6.5 10*3/uL (ref 3.8–10.6)

## 2013-07-20 ENCOUNTER — Ambulatory Visit: Payer: Self-pay | Admitting: Cardiology

## 2013-07-20 LAB — CBC WITH DIFFERENTIAL/PLATELET
Basophil #: 0 10*3/uL (ref 0.0–0.1)
HGB: 7.3 g/dL — ABNORMAL LOW (ref 13.0–18.0)
Lymphocyte %: 16.3 %
MCH: 27.4 pg (ref 26.0–34.0)
MCHC: 31.7 g/dL — ABNORMAL LOW (ref 32.0–36.0)
MCV: 86 fL (ref 80–100)
Monocyte #: 0.7 x10 3/mm (ref 0.2–1.0)
Monocyte %: 12.9 %
Neutrophil %: 67.8 %
Platelet: 191 10*3/uL (ref 150–440)
RBC: 2.66 10*6/uL — ABNORMAL LOW (ref 4.40–5.90)
RDW: 15.5 % — ABNORMAL HIGH (ref 11.5–14.5)
WBC: 5.8 10*3/uL (ref 3.8–10.6)

## 2013-07-26 ENCOUNTER — Ambulatory Visit: Payer: Self-pay | Admitting: Cardiology

## 2013-07-26 LAB — BASIC METABOLIC PANEL
Anion Gap: 7 (ref 7–16)
BUN: 25 mg/dL — ABNORMAL HIGH (ref 7–18)
Calcium, Total: 8.4 mg/dL — ABNORMAL LOW (ref 8.5–10.1)
Creatinine: 2.48 mg/dL — ABNORMAL HIGH (ref 0.60–1.30)
EGFR (African American): 30 — ABNORMAL LOW
EGFR (Non-African Amer.): 26 — ABNORMAL LOW
Osmolality: 280 (ref 275–301)
Potassium: 4.3 mmol/L (ref 3.5–5.1)

## 2013-07-26 LAB — CBC WITH DIFFERENTIAL/PLATELET
Basophil %: 0.6 %
HCT: 28.3 % — ABNORMAL LOW (ref 40.0–52.0)
HGB: 9 g/dL — ABNORMAL LOW (ref 13.0–18.0)
Lymphocyte #: 1 10*3/uL (ref 1.0–3.6)
MCHC: 31.8 g/dL — ABNORMAL LOW (ref 32.0–36.0)
MCV: 85 fL (ref 80–100)
Monocyte %: 15.1 %
Platelet: 197 10*3/uL (ref 150–440)
RBC: 3.34 10*6/uL — ABNORMAL LOW (ref 4.40–5.90)

## 2013-07-26 LAB — MAGNESIUM: Magnesium: 2 mg/dL

## 2013-08-02 ENCOUNTER — Ambulatory Visit: Payer: Self-pay | Admitting: Cardiology

## 2013-08-02 LAB — CBC WITH DIFFERENTIAL/PLATELET
Basophil %: 0.7 %
HCT: 29.9 % — ABNORMAL LOW (ref 40.0–52.0)
HGB: 9.4 g/dL — ABNORMAL LOW (ref 13.0–18.0)
Lymphocyte #: 1 10*3/uL (ref 1.0–3.6)
Monocyte #: 0.7 x10 3/mm (ref 0.2–1.0)
Monocyte %: 13.3 %
Neutrophil #: 3.2 10*3/uL (ref 1.4–6.5)
Neutrophil %: 63.9 %
RBC: 3.55 10*6/uL — ABNORMAL LOW (ref 4.40–5.90)
RDW: 13.8 % (ref 11.5–14.5)

## 2013-08-09 ENCOUNTER — Ambulatory Visit: Payer: Self-pay | Admitting: Cardiology

## 2013-08-09 LAB — CBC WITH DIFFERENTIAL/PLATELET
Basophil #: 0 10*3/uL (ref 0.0–0.1)
Eosinophil %: 3.2 %
HCT: 31.7 % — ABNORMAL LOW (ref 40.0–52.0)
HGB: 10.1 g/dL — ABNORMAL LOW (ref 13.0–18.0)
Lymphocyte #: 0.9 10*3/uL — ABNORMAL LOW (ref 1.0–3.6)
Lymphocyte %: 19.9 %
MCH: 26.3 pg (ref 26.0–34.0)
Monocyte #: 0.8 x10 3/mm (ref 0.2–1.0)
Neutrophil #: 2.5 10*3/uL (ref 1.4–6.5)
Neutrophil %: 57.3 %
Platelet: 198 10*3/uL (ref 150–440)
RBC: 3.83 10*6/uL — ABNORMAL LOW (ref 4.40–5.90)
RDW: 14.1 % (ref 11.5–14.5)

## 2013-09-09 ENCOUNTER — Ambulatory Visit: Payer: Self-pay | Admitting: Cardiology

## 2013-09-09 LAB — BASIC METABOLIC PANEL
BUN: 22 mg/dL — ABNORMAL HIGH (ref 7–18)
Calcium, Total: 9.1 mg/dL (ref 8.5–10.1)
Chloride: 104 mmol/L (ref 98–107)
Co2: 30 mmol/L (ref 21–32)
Creatinine: 2.29 mg/dL — ABNORMAL HIGH (ref 0.60–1.30)
EGFR (Non-African Amer.): 28 — ABNORMAL LOW
Glucose: 100 mg/dL — ABNORMAL HIGH (ref 65–99)
Potassium: 4.2 mmol/L (ref 3.5–5.1)
Sodium: 142 mmol/L (ref 136–145)

## 2013-09-09 LAB — CBC WITH DIFFERENTIAL/PLATELET
Basophil #: 0 10*3/uL (ref 0.0–0.1)
Eosinophil #: 0.1 10*3/uL (ref 0.0–0.7)
HGB: 10.6 g/dL — ABNORMAL LOW (ref 13.0–18.0)
Lymphocyte %: 22.3 %
Neutrophil #: 2.4 10*3/uL (ref 1.4–6.5)
RBC: 4.25 10*6/uL — ABNORMAL LOW (ref 4.40–5.90)
WBC: 4.2 10*3/uL (ref 3.8–10.6)

## 2013-10-28 ENCOUNTER — Ambulatory Visit: Payer: Self-pay | Admitting: Cardiology

## 2013-10-28 LAB — CBC WITH DIFFERENTIAL/PLATELET
Basophil #: 0 10*3/uL (ref 0.0–0.1)
Basophil %: 0.6 %
EOS ABS: 0.2 10*3/uL (ref 0.0–0.7)
Eosinophil %: 3.6 %
HCT: 35.6 % — ABNORMAL LOW (ref 40.0–52.0)
HGB: 11.1 g/dL — AB (ref 13.0–18.0)
Lymphocyte #: 1.1 10*3/uL (ref 1.0–3.6)
Lymphocyte %: 25.9 %
MCH: 23.7 pg — AB (ref 26.0–34.0)
MCHC: 31.1 g/dL — ABNORMAL LOW (ref 32.0–36.0)
MCV: 76 fL — ABNORMAL LOW (ref 80–100)
Monocyte #: 0.8 x10 3/mm (ref 0.2–1.0)
Monocyte %: 19.6 %
NEUTROS PCT: 50.3 %
Neutrophil #: 2.1 10*3/uL (ref 1.4–6.5)
Platelet: 173 10*3/uL (ref 150–440)
RBC: 4.66 10*6/uL (ref 4.40–5.90)
RDW: 13.6 % (ref 11.5–14.5)
WBC: 4.2 10*3/uL (ref 3.8–10.6)

## 2013-10-28 LAB — MAGNESIUM: Magnesium: 2.1 mg/dL

## 2013-10-28 LAB — BASIC METABOLIC PANEL
Anion Gap: 5 — ABNORMAL LOW (ref 7–16)
BUN: 27 mg/dL — AB (ref 7–18)
CO2: 29 mmol/L (ref 21–32)
CREATININE: 2.68 mg/dL — AB (ref 0.60–1.30)
Calcium, Total: 9.5 mg/dL (ref 8.5–10.1)
Chloride: 100 mmol/L (ref 98–107)
EGFR (African American): 27 — ABNORMAL LOW
EGFR (Non-African Amer.): 23 — ABNORMAL LOW
Glucose: 102 mg/dL — ABNORMAL HIGH (ref 65–99)
OSMOLALITY: 274 (ref 275–301)
Potassium: 5.2 mmol/L — ABNORMAL HIGH (ref 3.5–5.1)
SODIUM: 134 mmol/L — AB (ref 136–145)

## 2013-10-28 LAB — PHOSPHORUS: Phosphorus: 3.2 mg/dL (ref 2.5–4.9)

## 2014-01-31 ENCOUNTER — Ambulatory Visit: Payer: Self-pay | Admitting: Cardiology

## 2014-01-31 LAB — CBC WITH DIFFERENTIAL/PLATELET
BASOS PCT: 0.9 %
Basophil #: 0 10*3/uL (ref 0.0–0.1)
EOS PCT: 2.8 %
Eosinophil #: 0.1 10*3/uL (ref 0.0–0.7)
HCT: 21.9 % — AB (ref 40.0–52.0)
HGB: 6.8 g/dL — ABNORMAL LOW (ref 13.0–18.0)
Lymphocyte #: 0.7 10*3/uL — ABNORMAL LOW (ref 1.0–3.6)
Lymphocyte %: 17.5 %
MCH: 24.7 pg — ABNORMAL LOW (ref 26.0–34.0)
MCHC: 30.9 g/dL — AB (ref 32.0–36.0)
MCV: 80 fL (ref 80–100)
Monocyte #: 0.7 x10 3/mm (ref 0.2–1.0)
Monocyte %: 16.5 %
NEUTROS ABS: 2.6 10*3/uL (ref 1.4–6.5)
NEUTROS PCT: 62.3 %
Platelet: 176 10*3/uL (ref 150–440)
RBC: 2.74 10*6/uL — ABNORMAL LOW (ref 4.40–5.90)
RDW: 17.1 % — ABNORMAL HIGH (ref 11.5–14.5)
WBC: 4.2 10*3/uL (ref 3.8–10.6)

## 2014-02-04 ENCOUNTER — Ambulatory Visit: Payer: Self-pay | Admitting: Cardiology

## 2014-02-04 LAB — CBC WITH DIFFERENTIAL/PLATELET
BASOS PCT: 0.5 %
Basophil #: 0 10*3/uL (ref 0.0–0.1)
EOS PCT: 1.6 %
Eosinophil #: 0.1 10*3/uL (ref 0.0–0.7)
HCT: 29.8 % — AB (ref 40.0–52.0)
HGB: 9.5 g/dL — AB (ref 13.0–18.0)
LYMPHS ABS: 0.7 10*3/uL — AB (ref 1.0–3.6)
Lymphocyte %: 11.5 %
MCH: 26.3 pg (ref 26.0–34.0)
MCHC: 32 g/dL (ref 32.0–36.0)
MCV: 82 fL (ref 80–100)
Monocyte #: 0.7 x10 3/mm (ref 0.2–1.0)
Monocyte %: 11.3 %
NEUTROS ABS: 4.4 10*3/uL (ref 1.4–6.5)
NEUTROS PCT: 75.1 %
PLATELETS: 191 10*3/uL (ref 150–440)
RBC: 3.62 10*6/uL — ABNORMAL LOW (ref 4.40–5.90)
RDW: 16.9 % — ABNORMAL HIGH (ref 11.5–14.5)
WBC: 5.9 10*3/uL (ref 3.8–10.6)

## 2014-02-09 ENCOUNTER — Ambulatory Visit: Payer: Self-pay | Admitting: Cardiology

## 2014-02-09 LAB — CBC WITH DIFFERENTIAL/PLATELET
BASOS PCT: 0.6 %
Basophil #: 0 10*3/uL (ref 0.0–0.1)
EOS PCT: 3.2 %
Eosinophil #: 0.2 10*3/uL (ref 0.0–0.7)
HCT: 26.5 % — ABNORMAL LOW (ref 40.0–52.0)
HGB: 8.3 g/dL — ABNORMAL LOW (ref 13.0–18.0)
LYMPHS ABS: 0.8 10*3/uL — AB (ref 1.0–3.6)
Lymphocyte %: 16.2 %
MCH: 25.6 pg — ABNORMAL LOW (ref 26.0–34.0)
MCHC: 31.3 g/dL — AB (ref 32.0–36.0)
MCV: 82 fL (ref 80–100)
MONOS PCT: 16.3 %
Monocyte #: 0.8 x10 3/mm (ref 0.2–1.0)
NEUTROS ABS: 3 10*3/uL (ref 1.4–6.5)
Neutrophil %: 63.7 %
PLATELETS: 188 10*3/uL (ref 150–440)
RBC: 3.24 10*6/uL — ABNORMAL LOW (ref 4.40–5.90)
RDW: 16.1 % — AB (ref 11.5–14.5)
WBC: 4.7 10*3/uL (ref 3.8–10.6)

## 2014-02-16 ENCOUNTER — Ambulatory Visit: Payer: Self-pay | Admitting: Cardiology

## 2014-02-16 LAB — CBC WITH DIFFERENTIAL/PLATELET
Basophil #: 0 10*3/uL (ref 0.0–0.1)
Basophil %: 0.7 %
EOS ABS: 0.1 10*3/uL (ref 0.0–0.7)
EOS PCT: 2.1 %
HCT: 28.4 % — AB (ref 40.0–52.0)
HGB: 8.9 g/dL — ABNORMAL LOW (ref 13.0–18.0)
Lymphocyte #: 0.9 10*3/uL — ABNORMAL LOW (ref 1.0–3.6)
Lymphocyte %: 16.1 %
MCH: 26 pg (ref 26.0–34.0)
MCHC: 31.5 g/dL — AB (ref 32.0–36.0)
MCV: 83 fL (ref 80–100)
MONO ABS: 0.7 x10 3/mm (ref 0.2–1.0)
Monocyte %: 13.3 %
Neutrophil #: 3.7 10*3/uL (ref 1.4–6.5)
Neutrophil %: 67.8 %
Platelet: 206 10*3/uL (ref 150–440)
RBC: 3.44 10*6/uL — ABNORMAL LOW (ref 4.40–5.90)
RDW: 15.3 % — AB (ref 11.5–14.5)
WBC: 5.4 10*3/uL (ref 3.8–10.6)

## 2014-02-16 LAB — DIGOXIN LEVEL: DIGOXIN: 0.69 ng/mL

## 2014-02-24 ENCOUNTER — Ambulatory Visit: Payer: Self-pay | Admitting: Cardiology

## 2014-02-24 LAB — CBC WITH DIFFERENTIAL/PLATELET
BASOS ABS: 0 10*3/uL (ref 0.0–0.1)
BASOS PCT: 0.5 %
Eosinophil #: 0.1 10*3/uL (ref 0.0–0.7)
Eosinophil %: 1.9 %
HCT: 26.8 % — ABNORMAL LOW (ref 40.0–52.0)
HGB: 8.6 g/dL — AB (ref 13.0–18.0)
LYMPHS ABS: 0.7 10*3/uL — AB (ref 1.0–3.6)
Lymphocyte %: 12.5 %
MCH: 27.4 pg (ref 26.0–34.0)
MCHC: 32.1 g/dL (ref 32.0–36.0)
MCV: 85 fL (ref 80–100)
MONO ABS: 0.8 x10 3/mm (ref 0.2–1.0)
Monocyte %: 15 %
NEUTROS ABS: 3.8 10*3/uL (ref 1.4–6.5)
Neutrophil %: 70.1 %
Platelet: 183 10*3/uL (ref 150–440)
RBC: 3.14 10*6/uL — ABNORMAL LOW (ref 4.40–5.90)
RDW: 16.1 % — AB (ref 11.5–14.5)
WBC: 5.5 10*3/uL (ref 3.8–10.6)

## 2014-03-07 ENCOUNTER — Ambulatory Visit: Payer: Self-pay | Admitting: Cardiology

## 2014-03-07 LAB — CBC WITH DIFFERENTIAL/PLATELET
BASOS PCT: 0.3 %
Basophil #: 0 10*3/uL (ref 0.0–0.1)
Eosinophil #: 0.1 10*3/uL (ref 0.0–0.7)
Eosinophil %: 2.2 %
HCT: 25.9 % — AB (ref 40.0–52.0)
HGB: 8.2 g/dL — ABNORMAL LOW (ref 13.0–18.0)
LYMPHS PCT: 13.8 %
Lymphocyte #: 0.8 10*3/uL — ABNORMAL LOW (ref 1.0–3.6)
MCH: 27.5 pg (ref 26.0–34.0)
MCHC: 31.5 g/dL — AB (ref 32.0–36.0)
MCV: 87 fL (ref 80–100)
MONO ABS: 1 x10 3/mm (ref 0.2–1.0)
Monocyte %: 16.5 %
Neutrophil #: 4 10*3/uL (ref 1.4–6.5)
Neutrophil %: 67.2 %
PLATELETS: 211 10*3/uL (ref 150–440)
RBC: 2.97 10*6/uL — AB (ref 4.40–5.90)
RDW: 14.7 % — ABNORMAL HIGH (ref 11.5–14.5)
WBC: 5.9 10*3/uL (ref 3.8–10.6)

## 2014-03-15 ENCOUNTER — Ambulatory Visit: Payer: Self-pay | Admitting: Cardiology

## 2014-03-15 LAB — CBC WITH DIFFERENTIAL/PLATELET
BASOS PCT: 0.5 %
Basophil #: 0 10*3/uL (ref 0.0–0.1)
EOS ABS: 0.1 10*3/uL (ref 0.0–0.7)
EOS PCT: 2.3 %
HCT: 26.2 % — ABNORMAL LOW (ref 40.0–52.0)
HGB: 8.5 g/dL — AB (ref 13.0–18.0)
LYMPHS ABS: 0.7 10*3/uL — AB (ref 1.0–3.6)
LYMPHS PCT: 13.3 %
MCH: 27.8 pg (ref 26.0–34.0)
MCHC: 32.4 g/dL (ref 32.0–36.0)
MCV: 86 fL (ref 80–100)
Monocyte #: 0.8 x10 3/mm (ref 0.2–1.0)
Monocyte %: 14.8 %
NEUTROS ABS: 3.9 10*3/uL (ref 1.4–6.5)
NEUTROS PCT: 69.1 %
PLATELETS: 192 10*3/uL (ref 150–440)
RBC: 3.07 10*6/uL — AB (ref 4.40–5.90)
RDW: 13.7 % (ref 11.5–14.5)
WBC: 5.6 10*3/uL (ref 3.8–10.6)

## 2014-03-21 ENCOUNTER — Ambulatory Visit: Payer: Self-pay | Admitting: Cardiology

## 2014-03-21 LAB — CBC WITH DIFFERENTIAL/PLATELET
Basophil #: 0 10*3/uL (ref 0.0–0.1)
Basophil %: 0.5 %
Eosinophil #: 0.1 10*3/uL (ref 0.0–0.7)
Eosinophil %: 2.2 %
HCT: 30.2 % — ABNORMAL LOW (ref 40.0–52.0)
HGB: 9.9 g/dL — ABNORMAL LOW (ref 13.0–18.0)
LYMPHS ABS: 0.8 10*3/uL — AB (ref 1.0–3.6)
Lymphocyte %: 13.6 %
MCH: 28.1 pg (ref 26.0–34.0)
MCHC: 32.9 g/dL (ref 32.0–36.0)
MCV: 85 fL (ref 80–100)
MONO ABS: 1 x10 3/mm (ref 0.2–1.0)
Monocyte %: 16.2 %
NEUTROS PCT: 67.5 %
Neutrophil #: 4 10*3/uL (ref 1.4–6.5)
PLATELETS: 195 10*3/uL (ref 150–440)
RBC: 3.54 10*6/uL — AB (ref 4.40–5.90)
RDW: 14 % (ref 11.5–14.5)
WBC: 6 10*3/uL (ref 3.8–10.6)

## 2014-03-24 ENCOUNTER — Ambulatory Visit: Payer: Self-pay | Admitting: Cardiology

## 2014-03-24 LAB — CBC WITH DIFFERENTIAL/PLATELET
Basophil #: 0 10*3/uL (ref 0.0–0.1)
Basophil %: 0.4 %
Eosinophil #: 0.1 10*3/uL (ref 0.0–0.7)
Eosinophil %: 2.3 %
HCT: 28.1 % — ABNORMAL LOW (ref 40.0–52.0)
HGB: 9 g/dL — ABNORMAL LOW (ref 13.0–18.0)
Lymphocyte #: 0.7 10*3/uL — ABNORMAL LOW (ref 1.0–3.6)
Lymphocyte %: 15.3 %
MCH: 27.6 pg (ref 26.0–34.0)
MCHC: 32.2 g/dL (ref 32.0–36.0)
MCV: 86 fL (ref 80–100)
Monocyte #: 0.8 x10 3/mm (ref 0.2–1.0)
Monocyte %: 15.7 %
NEUTROS ABS: 3.2 10*3/uL (ref 1.4–6.5)
Neutrophil %: 66.3 %
PLATELETS: 193 10*3/uL (ref 150–440)
RBC: 3.28 10*6/uL — ABNORMAL LOW (ref 4.40–5.90)
RDW: 13.2 % (ref 11.5–14.5)
WBC: 4.8 10*3/uL (ref 3.8–10.6)

## 2014-04-04 ENCOUNTER — Ambulatory Visit: Payer: Self-pay | Admitting: Cardiology

## 2014-04-04 LAB — CBC WITH DIFFERENTIAL/PLATELET
BASOS ABS: 0 10*3/uL (ref 0.0–0.1)
Basophil %: 0.7 %
EOS PCT: 2.4 %
Eosinophil #: 0.1 10*3/uL (ref 0.0–0.7)
HCT: 29.2 % — ABNORMAL LOW (ref 40.0–52.0)
HGB: 9.4 g/dL — AB (ref 13.0–18.0)
Lymphocyte #: 0.7 10*3/uL — ABNORMAL LOW (ref 1.0–3.6)
Lymphocyte %: 13.8 %
MCH: 27.6 pg (ref 26.0–34.0)
MCHC: 32.2 g/dL (ref 32.0–36.0)
MCV: 86 fL (ref 80–100)
MONOS PCT: 15.4 %
Monocyte #: 0.8 x10 3/mm (ref 0.2–1.0)
Neutrophil #: 3.5 10*3/uL (ref 1.4–6.5)
Neutrophil %: 67.7 %
Platelet: 193 10*3/uL (ref 150–440)
RBC: 3.41 10*6/uL — AB (ref 4.40–5.90)
RDW: 13.7 % (ref 11.5–14.5)
WBC: 5.2 10*3/uL (ref 3.8–10.6)

## 2014-04-14 ENCOUNTER — Ambulatory Visit: Payer: Self-pay | Admitting: Cardiology

## 2014-04-14 LAB — CBC WITH DIFFERENTIAL/PLATELET
Basophil #: 0 10*3/uL (ref 0.0–0.1)
Basophil %: 0.5 %
Eosinophil #: 0.1 10*3/uL (ref 0.0–0.7)
Eosinophil %: 2.8 %
HCT: 24.7 % — ABNORMAL LOW (ref 40.0–52.0)
HGB: 7.9 g/dL — ABNORMAL LOW (ref 13.0–18.0)
Lymphocyte #: 0.6 10*3/uL — ABNORMAL LOW (ref 1.0–3.6)
Lymphocyte %: 12.4 %
MCH: 27.2 pg (ref 26.0–34.0)
MCHC: 32.2 g/dL (ref 32.0–36.0)
MCV: 84 fL (ref 80–100)
Monocyte #: 0.8 x10 3/mm (ref 0.2–1.0)
Monocyte %: 17.1 %
Neutrophil #: 3.2 10*3/uL (ref 1.4–6.5)
Neutrophil %: 67.2 %
Platelet: 187 10*3/uL (ref 150–440)
RBC: 2.92 10*6/uL — ABNORMAL LOW (ref 4.40–5.90)
RDW: 13.5 % (ref 11.5–14.5)
WBC: 4.8 10*3/uL (ref 3.8–10.6)

## 2014-04-27 ENCOUNTER — Ambulatory Visit: Payer: Self-pay | Admitting: Cardiology

## 2014-04-27 LAB — CBC WITH DIFFERENTIAL/PLATELET
BASOS ABS: 0 10*3/uL (ref 0.0–0.1)
Basophil %: 0.5 %
EOS ABS: 0.2 10*3/uL (ref 0.0–0.7)
Eosinophil %: 3.1 %
HCT: 24.2 % — ABNORMAL LOW (ref 40.0–52.0)
HGB: 7.8 g/dL — AB (ref 13.0–18.0)
Lymphocyte #: 0.7 10*3/uL — ABNORMAL LOW (ref 1.0–3.6)
Lymphocyte %: 13.8 %
MCH: 27.2 pg (ref 26.0–34.0)
MCHC: 32 g/dL (ref 32.0–36.0)
MCV: 85 fL (ref 80–100)
Monocyte #: 0.8 x10 3/mm (ref 0.2–1.0)
Monocyte %: 16.5 %
Neutrophil #: 3.2 10*3/uL (ref 1.4–6.5)
Neutrophil %: 66.1 %
PLATELETS: 190 10*3/uL (ref 150–440)
RBC: 2.85 10*6/uL — ABNORMAL LOW (ref 4.40–5.90)
RDW: 13.7 % (ref 11.5–14.5)
WBC: 4.9 10*3/uL (ref 3.8–10.6)

## 2014-05-02 ENCOUNTER — Ambulatory Visit: Payer: Self-pay | Admitting: Cardiology

## 2014-05-02 LAB — CBC WITH DIFFERENTIAL/PLATELET
BASOS ABS: 0 10*3/uL (ref 0.0–0.1)
Basophil %: 0.7 %
EOS ABS: 0.2 10*3/uL (ref 0.0–0.7)
Eosinophil %: 2.7 %
HCT: 28.4 % — ABNORMAL LOW (ref 40.0–52.0)
HGB: 9.1 g/dL — ABNORMAL LOW (ref 13.0–18.0)
Lymphocyte #: 0.7 10*3/uL — ABNORMAL LOW (ref 1.0–3.6)
Lymphocyte %: 11.7 %
MCH: 27.2 pg (ref 26.0–34.0)
MCHC: 32.1 g/dL (ref 32.0–36.0)
MCV: 85 fL (ref 80–100)
Monocyte #: 1 x10 3/mm (ref 0.2–1.0)
Monocyte %: 16.2 %
Neutrophil #: 4 10*3/uL (ref 1.4–6.5)
Neutrophil %: 68.7 %
PLATELETS: 187 10*3/uL (ref 150–440)
RBC: 3.35 10*6/uL — ABNORMAL LOW (ref 4.40–5.90)
RDW: 14.1 % (ref 11.5–14.5)
WBC: 5.9 10*3/uL (ref 3.8–10.6)

## 2014-05-05 ENCOUNTER — Ambulatory Visit: Payer: Self-pay | Admitting: Cardiology

## 2014-05-05 LAB — CBC WITH DIFFERENTIAL/PLATELET
BASOS ABS: 0 10*3/uL (ref 0.0–0.1)
Basophil %: 0.8 %
EOS PCT: 2.9 %
Eosinophil #: 0.1 10*3/uL (ref 0.0–0.7)
HCT: 26.7 % — ABNORMAL LOW (ref 40.0–52.0)
HGB: 8.6 g/dL — ABNORMAL LOW (ref 13.0–18.0)
LYMPHS ABS: 0.6 10*3/uL — AB (ref 1.0–3.6)
Lymphocyte %: 13.5 %
MCH: 27.2 pg (ref 26.0–34.0)
MCHC: 32.2 g/dL (ref 32.0–36.0)
MCV: 84 fL (ref 80–100)
MONO ABS: 0.8 x10 3/mm (ref 0.2–1.0)
Monocyte %: 17.3 %
NEUTROS ABS: 2.9 10*3/uL (ref 1.4–6.5)
NEUTROS PCT: 65.5 %
PLATELETS: 187 10*3/uL (ref 150–440)
RBC: 3.17 10*6/uL — ABNORMAL LOW (ref 4.40–5.90)
RDW: 13.6 % (ref 11.5–14.5)
WBC: 4.5 10*3/uL (ref 3.8–10.6)

## 2014-05-12 ENCOUNTER — Ambulatory Visit: Payer: Self-pay | Admitting: Cardiology

## 2014-05-12 LAB — CBC WITH DIFFERENTIAL/PLATELET
BASOS ABS: 0 10*3/uL (ref 0.0–0.1)
Basophil %: 0.4 %
EOS PCT: 2.8 %
Eosinophil #: 0.1 10*3/uL (ref 0.0–0.7)
HCT: 24.3 % — AB (ref 40.0–52.0)
HGB: 7.7 g/dL — AB (ref 13.0–18.0)
Lymphocyte #: 0.7 10*3/uL — ABNORMAL LOW (ref 1.0–3.6)
Lymphocyte %: 14.8 %
MCH: 26.7 pg (ref 26.0–34.0)
MCHC: 31.7 g/dL — AB (ref 32.0–36.0)
MCV: 84 fL (ref 80–100)
MONOS PCT: 16.6 %
Monocyte #: 0.7 x10 3/mm (ref 0.2–1.0)
NEUTROS ABS: 2.9 10*3/uL (ref 1.4–6.5)
Neutrophil %: 65.4 %
PLATELETS: 206 10*3/uL (ref 150–440)
RBC: 2.88 10*6/uL — AB (ref 4.40–5.90)
RDW: 13.9 % (ref 11.5–14.5)
WBC: 4.4 10*3/uL (ref 3.8–10.6)

## 2014-05-19 ENCOUNTER — Ambulatory Visit: Payer: Self-pay | Admitting: Cardiology

## 2014-05-19 LAB — CBC WITH DIFFERENTIAL/PLATELET
Basophil #: 0 10*3/uL (ref 0.0–0.1)
Basophil %: 0.6 %
Eosinophil #: 0.1 10*3/uL (ref 0.0–0.7)
Eosinophil %: 2.8 %
HCT: 27.8 % — ABNORMAL LOW (ref 40.0–52.0)
HGB: 8.8 g/dL — ABNORMAL LOW (ref 13.0–18.0)
Lymphocyte #: 0.6 10*3/uL — ABNORMAL LOW (ref 1.0–3.6)
Lymphocyte %: 12 %
MCH: 27.3 pg (ref 26.0–34.0)
MCHC: 31.5 g/dL — AB (ref 32.0–36.0)
MCV: 86 fL (ref 80–100)
MONOS PCT: 16 %
Monocyte #: 0.7 x10 3/mm (ref 0.2–1.0)
NEUTROS PCT: 68.6 %
Neutrophil #: 3.2 10*3/uL (ref 1.4–6.5)
Platelet: 192 10*3/uL (ref 150–440)
RBC: 3.22 10*6/uL — AB (ref 4.40–5.90)
RDW: 14.4 % (ref 11.5–14.5)
WBC: 4.7 10*3/uL (ref 3.8–10.6)

## 2014-05-26 ENCOUNTER — Ambulatory Visit: Payer: Self-pay | Admitting: Cardiology

## 2014-05-26 LAB — CBC WITH DIFFERENTIAL/PLATELET
Basophil #: 0 10*3/uL (ref 0.0–0.1)
Basophil %: 0.4 %
Eosinophil #: 0.1 10*3/uL (ref 0.0–0.7)
Eosinophil %: 2.8 %
HCT: 31.8 % — AB (ref 40.0–52.0)
HGB: 10.2 g/dL — ABNORMAL LOW (ref 13.0–18.0)
LYMPHS PCT: 13.5 %
Lymphocyte #: 0.7 10*3/uL — ABNORMAL LOW (ref 1.0–3.6)
MCH: 27.7 pg (ref 26.0–34.0)
MCHC: 32.2 g/dL (ref 32.0–36.0)
MCV: 86 fL (ref 80–100)
Monocyte #: 0.6 x10 3/mm (ref 0.2–1.0)
Monocyte %: 12.5 %
NEUTROS ABS: 3.6 10*3/uL (ref 1.4–6.5)
NEUTROS PCT: 70.8 %
Platelet: 162 10*3/uL (ref 150–440)
RBC: 3.69 10*6/uL — ABNORMAL LOW (ref 4.40–5.90)
RDW: 14.1 % (ref 11.5–14.5)
WBC: 5 10*3/uL (ref 3.8–10.6)

## 2014-06-01 ENCOUNTER — Ambulatory Visit: Payer: Self-pay | Admitting: Cardiology

## 2014-06-01 LAB — CBC WITH DIFFERENTIAL/PLATELET
Basophil #: 0 10*3/uL (ref 0.0–0.1)
Basophil %: 0.7 %
EOS ABS: 0.1 10*3/uL (ref 0.0–0.7)
Eosinophil %: 3.4 %
HCT: 28.4 % — ABNORMAL LOW (ref 40.0–52.0)
HGB: 9.1 g/dL — AB (ref 13.0–18.0)
LYMPHS ABS: 0.6 10*3/uL — AB (ref 1.0–3.6)
Lymphocyte %: 15 %
MCH: 27.4 pg (ref 26.0–34.0)
MCHC: 32.2 g/dL (ref 32.0–36.0)
MCV: 85 fL (ref 80–100)
MONOS PCT: 16.2 %
Monocyte #: 0.7 x10 3/mm (ref 0.2–1.0)
NEUTROS ABS: 2.7 10*3/uL (ref 1.4–6.5)
Neutrophil %: 64.7 %
Platelet: 188 10*3/uL (ref 150–440)
RBC: 3.34 10*6/uL — AB (ref 4.40–5.90)
RDW: 13.3 % (ref 11.5–14.5)
WBC: 4.2 10*3/uL (ref 3.8–10.6)

## 2014-06-15 ENCOUNTER — Ambulatory Visit: Payer: Self-pay | Admitting: Cardiology

## 2014-06-15 LAB — CBC WITH DIFFERENTIAL/PLATELET
Basophil #: 0 10*3/uL (ref 0.0–0.1)
Basophil %: 0.4 %
EOS ABS: 0.1 10*3/uL (ref 0.0–0.7)
Eosinophil %: 3.4 %
HCT: 22.7 % — ABNORMAL LOW (ref 40.0–52.0)
HGB: 7.2 g/dL — AB (ref 13.0–18.0)
Lymphocyte #: 0.8 10*3/uL — ABNORMAL LOW (ref 1.0–3.6)
Lymphocyte %: 18.2 %
MCH: 26.7 pg (ref 26.0–34.0)
MCHC: 31.6 g/dL — AB (ref 32.0–36.0)
MCV: 85 fL (ref 80–100)
MONO ABS: 0.7 x10 3/mm (ref 0.2–1.0)
Monocyte %: 16.3 %
NEUTROS ABS: 2.6 10*3/uL (ref 1.4–6.5)
Neutrophil %: 61.7 %
Platelet: 192 10*3/uL (ref 150–440)
RBC: 2.69 10*6/uL — AB (ref 4.40–5.90)
RDW: 13.8 % (ref 11.5–14.5)
WBC: 4.2 10*3/uL (ref 3.8–10.6)

## 2014-06-20 ENCOUNTER — Ambulatory Visit: Payer: Self-pay | Admitting: Cardiology

## 2014-06-20 LAB — CBC WITH DIFFERENTIAL/PLATELET
BASOS PCT: 0.6 %
Basophil #: 0 10*3/uL (ref 0.0–0.1)
EOS ABS: 0.1 10*3/uL (ref 0.0–0.7)
EOS PCT: 3.3 %
HCT: 28.5 % — AB (ref 40.0–52.0)
HGB: 9 g/dL — ABNORMAL LOW (ref 13.0–18.0)
LYMPHS ABS: 0.6 10*3/uL — AB (ref 1.0–3.6)
Lymphocyte %: 14.6 %
MCH: 26.8 pg (ref 26.0–34.0)
MCHC: 31.5 g/dL — AB (ref 32.0–36.0)
MCV: 85 fL (ref 80–100)
MONO ABS: 0.7 x10 3/mm (ref 0.2–1.0)
MONOS PCT: 17.3 %
Neutrophil #: 2.7 10*3/uL (ref 1.4–6.5)
Neutrophil %: 64.2 %
PLATELETS: 159 10*3/uL (ref 150–440)
RBC: 3.35 10*6/uL — ABNORMAL LOW (ref 4.40–5.90)
RDW: 14.2 % (ref 11.5–14.5)
WBC: 4.2 10*3/uL (ref 3.8–10.6)

## 2014-06-28 ENCOUNTER — Ambulatory Visit: Payer: Self-pay | Admitting: Cardiology

## 2014-06-28 LAB — CBC WITH DIFFERENTIAL/PLATELET
Basophil #: 0 10*3/uL (ref 0.0–0.1)
Basophil %: 0.9 %
EOS PCT: 2.6 %
Eosinophil #: 0.1 10*3/uL (ref 0.0–0.7)
HCT: 25 % — AB (ref 40.0–52.0)
HGB: 7.8 g/dL — ABNORMAL LOW (ref 13.0–18.0)
LYMPHS ABS: 0.7 10*3/uL — AB (ref 1.0–3.6)
LYMPHS PCT: 14.3 %
MCH: 26.5 pg (ref 26.0–34.0)
MCHC: 31.3 g/dL — ABNORMAL LOW (ref 32.0–36.0)
MCV: 85 fL (ref 80–100)
MONO ABS: 0.7 x10 3/mm (ref 0.2–1.0)
Monocyte %: 16 %
Neutrophil #: 3 10*3/uL (ref 1.4–6.5)
Neutrophil %: 66.2 %
Platelet: 189 10*3/uL (ref 150–440)
RBC: 2.95 10*6/uL — ABNORMAL LOW (ref 4.40–5.90)
RDW: 13.3 % (ref 11.5–14.5)
WBC: 4.6 10*3/uL (ref 3.8–10.6)

## 2014-07-07 ENCOUNTER — Ambulatory Visit: Payer: Self-pay | Admitting: Cardiology

## 2014-07-07 LAB — CBC WITH DIFFERENTIAL/PLATELET
BASOS PCT: 0.6 %
Basophil #: 0 10*3/uL (ref 0.0–0.1)
EOS ABS: 0.1 10*3/uL (ref 0.0–0.7)
Eosinophil %: 2.5 %
HCT: 29 % — AB (ref 40.0–52.0)
HGB: 9.1 g/dL — AB (ref 13.0–18.0)
Lymphocyte #: 0.6 10*3/uL — ABNORMAL LOW (ref 1.0–3.6)
Lymphocyte %: 13.7 %
MCH: 26.9 pg (ref 26.0–34.0)
MCHC: 31.4 g/dL — ABNORMAL LOW (ref 32.0–36.0)
MCV: 86 fL (ref 80–100)
Monocyte #: 0.7 x10 3/mm (ref 0.2–1.0)
Monocyte %: 15.3 %
NEUTROS ABS: 3.1 10*3/uL (ref 1.4–6.5)
Neutrophil %: 67.9 %
PLATELETS: 231 10*3/uL (ref 150–440)
RBC: 3.38 10*6/uL — ABNORMAL LOW (ref 4.40–5.90)
RDW: 14 % (ref 11.5–14.5)
WBC: 4.5 10*3/uL (ref 3.8–10.6)

## 2014-07-14 ENCOUNTER — Ambulatory Visit: Payer: Self-pay | Admitting: Cardiology

## 2014-07-14 LAB — CBC WITH DIFFERENTIAL/PLATELET
Basophil #: 0 10*3/uL (ref 0.0–0.1)
Basophil %: 0.5 %
Eosinophil #: 0.1 10*3/uL (ref 0.0–0.7)
Eosinophil %: 2.5 %
HCT: 22.3 % — AB (ref 40.0–52.0)
HGB: 7 g/dL — ABNORMAL LOW (ref 13.0–18.0)
LYMPHS ABS: 0.5 10*3/uL — AB (ref 1.0–3.6)
Lymphocyte %: 10.6 %
MCH: 26.7 pg (ref 26.0–34.0)
MCHC: 31.4 g/dL — AB (ref 32.0–36.0)
MCV: 85 fL (ref 80–100)
MONO ABS: 0.7 x10 3/mm (ref 0.2–1.0)
Monocyte %: 15.6 %
NEUTROS PCT: 70.8 %
Neutrophil #: 3.4 10*3/uL (ref 1.4–6.5)
PLATELETS: 197 10*3/uL (ref 150–440)
RBC: 2.63 10*6/uL — ABNORMAL LOW (ref 4.40–5.90)
RDW: 13.7 % (ref 11.5–14.5)
WBC: 4.8 10*3/uL (ref 3.8–10.6)

## 2014-07-25 ENCOUNTER — Ambulatory Visit: Payer: Self-pay | Admitting: Cardiology

## 2014-07-25 LAB — CBC WITH DIFFERENTIAL/PLATELET
BASOS ABS: 0 10*3/uL (ref 0.0–0.1)
Basophil %: 0.9 %
Eosinophil #: 0.1 10*3/uL (ref 0.0–0.7)
Eosinophil %: 2.5 %
HCT: 28.9 % — ABNORMAL LOW (ref 40.0–52.0)
HGB: 9.2 g/dL — AB (ref 13.0–18.0)
LYMPHS ABS: 0.7 10*3/uL — AB (ref 1.0–3.6)
LYMPHS PCT: 13.8 %
MCH: 26.7 pg (ref 26.0–34.0)
MCHC: 31.8 g/dL — ABNORMAL LOW (ref 32.0–36.0)
MCV: 84 fL (ref 80–100)
MONO ABS: 0.7 x10 3/mm (ref 0.2–1.0)
MONOS PCT: 15.3 %
Neutrophil #: 3.3 10*3/uL (ref 1.4–6.5)
Neutrophil %: 67.5 %
PLATELETS: 176 10*3/uL (ref 150–440)
RBC: 3.44 10*6/uL — AB (ref 4.40–5.90)
RDW: 13.6 % (ref 11.5–14.5)
WBC: 4.9 10*3/uL (ref 3.8–10.6)

## 2014-08-08 ENCOUNTER — Ambulatory Visit: Payer: Self-pay | Admitting: Cardiology

## 2014-08-08 LAB — CBC WITH DIFFERENTIAL/PLATELET
Basophil #: 0 10*3/uL (ref 0.0–0.1)
Basophil %: 0.5 %
EOS PCT: 2.8 %
Eosinophil #: 0.1 10*3/uL (ref 0.0–0.7)
HCT: 33.3 % — AB (ref 40.0–52.0)
HGB: 10.5 g/dL — ABNORMAL LOW (ref 13.0–18.0)
LYMPHS ABS: 0.8 10*3/uL — AB (ref 1.0–3.6)
Lymphocyte %: 15.8 %
MCH: 25.8 pg — ABNORMAL LOW (ref 26.0–34.0)
MCHC: 31.6 g/dL — AB (ref 32.0–36.0)
MCV: 82 fL (ref 80–100)
MONO ABS: 0.9 x10 3/mm (ref 0.2–1.0)
Monocyte %: 17.5 %
NEUTROS ABS: 3.2 10*3/uL (ref 1.4–6.5)
NEUTROS PCT: 63.4 %
PLATELETS: 195 10*3/uL (ref 150–440)
RBC: 4.07 10*6/uL — AB (ref 4.40–5.90)
RDW: 13.6 % (ref 11.5–14.5)
WBC: 5 10*3/uL (ref 3.8–10.6)

## 2014-08-17 ENCOUNTER — Ambulatory Visit: Payer: Self-pay | Admitting: Cardiology

## 2014-08-17 LAB — CBC WITH DIFFERENTIAL/PLATELET
BASOS ABS: 0 10*3/uL (ref 0.0–0.1)
Basophil %: 0.5 %
Eosinophil #: 0.2 10*3/uL (ref 0.0–0.7)
Eosinophil %: 4.1 %
HCT: 34.4 % — ABNORMAL LOW (ref 40.0–52.0)
HGB: 10.8 g/dL — AB (ref 13.0–18.0)
Lymphocyte #: 0.8 10*3/uL — ABNORMAL LOW (ref 1.0–3.6)
Lymphocyte %: 18.2 %
MCH: 25.3 pg — ABNORMAL LOW (ref 26.0–34.0)
MCHC: 31.4 g/dL — ABNORMAL LOW (ref 32.0–36.0)
MCV: 81 fL (ref 80–100)
MONOS PCT: 18.5 %
Monocyte #: 0.8 x10 3/mm (ref 0.2–1.0)
NEUTROS ABS: 2.5 10*3/uL (ref 1.4–6.5)
Neutrophil %: 58.7 %
PLATELETS: 174 10*3/uL (ref 150–440)
RBC: 4.27 10*6/uL — ABNORMAL LOW (ref 4.40–5.90)
RDW: 13.6 % (ref 11.5–14.5)
WBC: 4.3 10*3/uL (ref 3.8–10.6)

## 2014-08-17 LAB — URINALYSIS, COMPLETE
Bacteria: NEGATIVE
Bilirubin,UR: NEGATIVE
GLUCOSE, UR: NEGATIVE
Ketone: NEGATIVE
Leukocyte Esterase: NEGATIVE
NITRITE: NEGATIVE
Ph: 6 (ref 5.0–8.0)
Protein: 30
Specific Gravity: 1.02 (ref 1.000–1.030)

## 2014-08-25 ENCOUNTER — Ambulatory Visit: Payer: Self-pay | Admitting: Cardiology

## 2014-08-25 LAB — URINALYSIS, COMPLETE
BILIRUBIN, UR: NEGATIVE
Bacteria: NEGATIVE
GLUCOSE, UR: NEGATIVE
Ketone: NEGATIVE
Leukocyte Esterase: NEGATIVE
Nitrite: NEGATIVE
Ph: 6.5 (ref 5.0–8.0)
Protein: 100
SPECIFIC GRAVITY: 1.02 (ref 1.000–1.030)
Squamous Epithelial: NONE SEEN

## 2014-09-01 ENCOUNTER — Ambulatory Visit: Payer: Self-pay | Admitting: Cardiology

## 2014-09-01 LAB — CBC WITH DIFFERENTIAL/PLATELET
BASOS ABS: 0 10*3/uL (ref 0.0–0.1)
Basophil %: 0.7 %
EOS ABS: 0.1 10*3/uL (ref 0.0–0.7)
EOS PCT: 3.9 %
HCT: 35.7 % — ABNORMAL LOW (ref 40.0–52.0)
HGB: 11 g/dL — ABNORMAL LOW (ref 13.0–18.0)
LYMPHS PCT: 17.4 %
Lymphocyte #: 0.7 10*3/uL — ABNORMAL LOW (ref 1.0–3.6)
MCH: 24.4 pg — ABNORMAL LOW (ref 26.0–34.0)
MCHC: 30.9 g/dL — AB (ref 32.0–36.0)
MCV: 79 fL — AB (ref 80–100)
MONO ABS: 0.8 x10 3/mm (ref 0.2–1.0)
MONOS PCT: 21.8 %
NEUTROS ABS: 2.1 10*3/uL (ref 1.4–6.5)
Neutrophil %: 56.2 %
PLATELETS: 158 10*3/uL (ref 150–440)
RBC: 4.52 10*6/uL (ref 4.40–5.90)
RDW: 13 % (ref 11.5–14.5)
WBC: 3.8 10*3/uL (ref 3.8–10.6)

## 2014-09-01 LAB — URINALYSIS, COMPLETE
BACTERIA: NEGATIVE
BILIRUBIN, UR: NEGATIVE
Glucose,UR: NEGATIVE
KETONE: NEGATIVE
Leukocyte Esterase: NEGATIVE
NITRITE: NEGATIVE
Ph: 5.5 (ref 5.0–8.0)
Protein: 100
Specific Gravity: 1.02 (ref 1.000–1.030)
Squamous Epithelial: NONE SEEN

## 2014-09-08 ENCOUNTER — Ambulatory Visit: Payer: Self-pay | Admitting: Cardiology

## 2014-09-08 LAB — CBC WITH DIFFERENTIAL/PLATELET
BASOS PCT: 0.6 %
Basophil #: 0 10*3/uL (ref 0.0–0.1)
Eosinophil #: 0.2 10*3/uL (ref 0.0–0.7)
Eosinophil %: 4.7 %
HCT: 35.3 % — AB (ref 40.0–52.0)
HGB: 10.9 g/dL — ABNORMAL LOW (ref 13.0–18.0)
LYMPHS PCT: 20.5 %
Lymphocyte #: 0.7 10*3/uL — ABNORMAL LOW (ref 1.0–3.6)
MCH: 24.3 pg — AB (ref 26.0–34.0)
MCHC: 30.9 g/dL — ABNORMAL LOW (ref 32.0–36.0)
MCV: 79 fL — ABNORMAL LOW (ref 80–100)
Monocyte #: 0.7 x10 3/mm (ref 0.2–1.0)
Monocyte %: 18.3 %
NEUTROS ABS: 2 10*3/uL (ref 1.4–6.5)
Neutrophil %: 55.9 %
PLATELETS: 157 10*3/uL (ref 150–440)
RBC: 4.5 10*6/uL (ref 4.40–5.90)
RDW: 13.1 % (ref 11.5–14.5)
WBC: 3.6 10*3/uL — AB (ref 3.8–10.6)

## 2014-09-08 LAB — URINALYSIS, COMPLETE
Bacteria: NEGATIVE
Bilirubin,UR: NEGATIVE
Glucose,UR: NEGATIVE
Ketone: NEGATIVE
Leukocyte Esterase: NEGATIVE
NITRITE: NEGATIVE
Ph: 6 (ref 5.0–8.0)
Specific Gravity: 1.025 (ref 1.000–1.030)

## 2014-09-19 ENCOUNTER — Ambulatory Visit: Payer: Self-pay | Admitting: Internal Medicine

## 2014-09-19 LAB — CBC WITH DIFFERENTIAL/PLATELET
BASOS PCT: 0.6 %
Basophil #: 0 10*3/uL (ref 0.0–0.1)
Eosinophil #: 0.2 10*3/uL (ref 0.0–0.7)
Eosinophil %: 4.3 %
HCT: 35.4 % — AB (ref 40.0–52.0)
HGB: 10.8 g/dL — AB (ref 13.0–18.0)
LYMPHS PCT: 18.5 %
Lymphocyte #: 0.7 10*3/uL — ABNORMAL LOW (ref 1.0–3.6)
MCH: 23.9 pg — AB (ref 26.0–34.0)
MCHC: 30.5 g/dL — ABNORMAL LOW (ref 32.0–36.0)
MCV: 78 fL — ABNORMAL LOW (ref 80–100)
MONOS PCT: 21 %
Monocyte #: 0.8 x10 3/mm (ref 0.2–1.0)
Neutrophil #: 2.2 10*3/uL (ref 1.4–6.5)
Neutrophil %: 55.6 %
Platelet: 147 10*3/uL — ABNORMAL LOW (ref 150–440)
RBC: 4.52 10*6/uL (ref 4.40–5.90)
RDW: 13 % (ref 11.5–14.5)
WBC: 4 10*3/uL (ref 3.8–10.6)

## 2014-09-28 ENCOUNTER — Ambulatory Visit: Payer: Self-pay | Admitting: Cardiology

## 2014-09-28 LAB — BASIC METABOLIC PANEL
Anion Gap: 6 — ABNORMAL LOW (ref 7–16)
BUN: 26 mg/dL — ABNORMAL HIGH (ref 7–18)
Calcium, Total: 8.7 mg/dL (ref 8.5–10.1)
Chloride: 105 mmol/L (ref 98–107)
Co2: 29 mmol/L (ref 21–32)
Creatinine: 2.56 mg/dL — ABNORMAL HIGH (ref 0.60–1.30)
EGFR (African American): 32 — ABNORMAL LOW
EGFR (Non-African Amer.): 27 — ABNORMAL LOW
Glucose: 125 mg/dL — ABNORMAL HIGH (ref 65–99)
Osmolality: 286 (ref 275–301)
Potassium: 4.6 mmol/L (ref 3.5–5.1)
Sodium: 140 mmol/L (ref 136–145)

## 2014-09-28 LAB — CBC WITH DIFFERENTIAL/PLATELET
Basophil #: 0 10*3/uL (ref 0.0–0.1)
Basophil %: 0.4 %
Eosinophil #: 0.1 10*3/uL (ref 0.0–0.7)
Eosinophil %: 3.4 %
HCT: 37 % — ABNORMAL LOW (ref 40.0–52.0)
HGB: 11.2 g/dL — ABNORMAL LOW (ref 13.0–18.0)
Lymphocyte #: 0.7 10*3/uL — ABNORMAL LOW (ref 1.0–3.6)
Lymphocyte %: 19.9 %
MCH: 23.7 pg — ABNORMAL LOW (ref 26.0–34.0)
MCHC: 30.4 g/dL — ABNORMAL LOW (ref 32.0–36.0)
MCV: 78 fL — ABNORMAL LOW (ref 80–100)
Monocyte #: 0.6 x10 3/mm (ref 0.2–1.0)
Monocyte %: 15.4 %
Neutrophil #: 2.3 10*3/uL (ref 1.4–6.5)
Neutrophil %: 60.9 %
Platelet: 153 10*3/uL (ref 150–440)
RBC: 4.74 10*6/uL (ref 4.40–5.90)
RDW: 13.3 % (ref 11.5–14.5)
WBC: 3.7 10*3/uL — ABNORMAL LOW (ref 3.8–10.6)

## 2014-10-10 ENCOUNTER — Ambulatory Visit: Payer: Self-pay | Admitting: Cardiology

## 2014-10-10 LAB — CBC WITH DIFFERENTIAL/PLATELET
BASOS PCT: 0.9 %
Basophil #: 0.1 10*3/uL (ref 0.0–0.1)
EOS ABS: 0.3 10*3/uL (ref 0.0–0.7)
EOS PCT: 4.8 %
HCT: 35.9 % — ABNORMAL LOW (ref 40.0–52.0)
HGB: 11.1 g/dL — ABNORMAL LOW (ref 13.0–18.0)
Lymphocyte #: 1 10*3/uL (ref 1.0–3.6)
Lymphocyte %: 16.7 %
MCH: 23.6 pg — ABNORMAL LOW (ref 26.0–34.0)
MCHC: 30.8 g/dL — ABNORMAL LOW (ref 32.0–36.0)
MCV: 77 fL — AB (ref 80–100)
Monocyte #: 0.7 x10 3/mm (ref 0.2–1.0)
Monocyte %: 11 %
NEUTROS PCT: 66.6 %
Neutrophil #: 4.2 10*3/uL (ref 1.4–6.5)
PLATELETS: 233 10*3/uL (ref 150–440)
RBC: 4.69 10*6/uL (ref 4.40–5.90)
RDW: 13 % (ref 11.5–14.5)
WBC: 6.2 10*3/uL (ref 3.8–10.6)

## 2014-10-26 DIAGNOSIS — Z4801 Encounter for change or removal of surgical wound dressing: Secondary | ICD-10-CM | POA: Diagnosis not present

## 2014-10-26 DIAGNOSIS — I428 Other cardiomyopathies: Secondary | ICD-10-CM | POA: Diagnosis not present

## 2014-10-26 DIAGNOSIS — Z48812 Encounter for surgical aftercare following surgery on the circulatory system: Secondary | ICD-10-CM | POA: Diagnosis not present

## 2014-10-26 DIAGNOSIS — Z95811 Presence of heart assist device: Secondary | ICD-10-CM | POA: Diagnosis not present

## 2014-10-31 DIAGNOSIS — I5022 Chronic systolic (congestive) heart failure: Secondary | ICD-10-CM | POA: Diagnosis not present

## 2014-11-14 ENCOUNTER — Ambulatory Visit: Payer: Self-pay | Admitting: Cardiology

## 2014-11-14 DIAGNOSIS — I509 Heart failure, unspecified: Secondary | ICD-10-CM | POA: Diagnosis not present

## 2014-11-14 DIAGNOSIS — Z95811 Presence of heart assist device: Secondary | ICD-10-CM | POA: Diagnosis not present

## 2014-11-14 LAB — CBC WITH DIFFERENTIAL/PLATELET
BASOS PCT: 1 %
Basophil #: 0 10*3/uL (ref 0.0–0.1)
Eosinophil #: 0.1 10*3/uL (ref 0.0–0.7)
Eosinophil %: 3.6 %
HCT: 34.4 % — AB (ref 40.0–52.0)
HGB: 10.5 g/dL — ABNORMAL LOW (ref 13.0–18.0)
LYMPHS PCT: 20.2 %
Lymphocyte #: 0.8 10*3/uL — ABNORMAL LOW (ref 1.0–3.6)
MCH: 23.1 pg — ABNORMAL LOW (ref 26.0–34.0)
MCHC: 30.5 g/dL — AB (ref 32.0–36.0)
MCV: 76 fL — ABNORMAL LOW (ref 80–100)
MONOS PCT: 17.8 %
Monocyte #: 0.7 x10 3/mm (ref 0.2–1.0)
NEUTROS ABS: 2.2 10*3/uL (ref 1.4–6.5)
Neutrophil %: 57.4 %
Platelet: 169 10*3/uL (ref 150–440)
RBC: 4.54 10*6/uL (ref 4.40–5.90)
RDW: 13.8 % (ref 11.5–14.5)
WBC: 3.8 10*3/uL (ref 3.8–10.6)

## 2014-11-29 ENCOUNTER — Ambulatory Visit: Payer: Self-pay | Admitting: Cardiology

## 2014-11-29 DIAGNOSIS — Z95811 Presence of heart assist device: Secondary | ICD-10-CM | POA: Diagnosis not present

## 2014-11-29 DIAGNOSIS — I509 Heart failure, unspecified: Secondary | ICD-10-CM | POA: Diagnosis not present

## 2014-12-16 DIAGNOSIS — R809 Proteinuria, unspecified: Secondary | ICD-10-CM | POA: Diagnosis not present

## 2014-12-16 DIAGNOSIS — I5022 Chronic systolic (congestive) heart failure: Secondary | ICD-10-CM | POA: Diagnosis not present

## 2014-12-16 DIAGNOSIS — Z95811 Presence of heart assist device: Secondary | ICD-10-CM | POA: Diagnosis not present

## 2014-12-16 DIAGNOSIS — E039 Hypothyroidism, unspecified: Secondary | ICD-10-CM | POA: Diagnosis not present

## 2014-12-16 DIAGNOSIS — N529 Male erectile dysfunction, unspecified: Secondary | ICD-10-CM | POA: Diagnosis not present

## 2014-12-16 DIAGNOSIS — I499 Cardiac arrhythmia, unspecified: Secondary | ICD-10-CM | POA: Diagnosis not present

## 2014-12-16 DIAGNOSIS — K264 Chronic or unspecified duodenal ulcer with hemorrhage: Secondary | ICD-10-CM | POA: Diagnosis not present

## 2014-12-16 DIAGNOSIS — D649 Anemia, unspecified: Secondary | ICD-10-CM | POA: Diagnosis not present

## 2014-12-16 DIAGNOSIS — E785 Hyperlipidemia, unspecified: Secondary | ICD-10-CM | POA: Diagnosis not present

## 2014-12-16 DIAGNOSIS — I129 Hypertensive chronic kidney disease with stage 1 through stage 4 chronic kidney disease, or unspecified chronic kidney disease: Secondary | ICD-10-CM | POA: Diagnosis not present

## 2014-12-16 DIAGNOSIS — K922 Gastrointestinal hemorrhage, unspecified: Secondary | ICD-10-CM | POA: Diagnosis not present

## 2014-12-16 DIAGNOSIS — D508 Other iron deficiency anemias: Secondary | ICD-10-CM | POA: Diagnosis not present

## 2014-12-16 DIAGNOSIS — N189 Chronic kidney disease, unspecified: Secondary | ICD-10-CM | POA: Diagnosis not present

## 2014-12-16 DIAGNOSIS — I5020 Unspecified systolic (congestive) heart failure: Secondary | ICD-10-CM | POA: Diagnosis not present

## 2015-01-04 ENCOUNTER — Ambulatory Visit: Payer: Self-pay | Admitting: Cardiology

## 2015-01-04 DIAGNOSIS — Z95811 Presence of heart assist device: Secondary | ICD-10-CM | POA: Diagnosis not present

## 2015-01-04 DIAGNOSIS — I509 Heart failure, unspecified: Secondary | ICD-10-CM | POA: Diagnosis not present

## 2015-01-25 ENCOUNTER — Ambulatory Visit
Admit: 2015-01-25 | Disposition: A | Discharge status: Home / Self Care | Payer: Self-pay | Attending: Cardiology | Admitting: Cardiology

## 2015-01-25 DIAGNOSIS — I509 Heart failure, unspecified: Secondary | ICD-10-CM | POA: Diagnosis not present

## 2015-01-25 DIAGNOSIS — Z95811 Presence of heart assist device: Secondary | ICD-10-CM | POA: Diagnosis not present

## 2015-01-25 LAB — CBC WITH DIFFERENTIAL/PLATELET
Basophil #: 0 10*3/uL (ref 0.0–0.1)
Basophil %: 1.2 %
EOS ABS: 0.2 10*3/uL (ref 0.0–0.7)
Eosinophil %: 4.6 %
HCT: 35 % — ABNORMAL LOW (ref 40.0–52.0)
HGB: 10.9 g/dL — ABNORMAL LOW (ref 13.0–18.0)
Lymphocyte #: 0.8 10*3/uL — ABNORMAL LOW (ref 1.0–3.6)
Lymphocyte %: 21.6 %
MCH: 23.5 pg — AB (ref 26.0–34.0)
MCHC: 31 g/dL — ABNORMAL LOW (ref 32.0–36.0)
MCV: 76 fL — ABNORMAL LOW (ref 80–100)
MONO ABS: 0.7 x10 3/mm (ref 0.2–1.0)
Monocyte %: 18.4 %
NEUTROS PCT: 54.2 %
Neutrophil #: 2 10*3/uL (ref 1.4–6.5)
PLATELETS: 158 10*3/uL (ref 150–440)
RBC: 4.62 10*6/uL (ref 4.40–5.90)
RDW: 14.7 % — ABNORMAL HIGH (ref 11.5–14.5)
WBC: 3.7 10*3/uL — ABNORMAL LOW (ref 3.8–10.6)

## 2015-01-25 LAB — TSH: Thyroid Stimulating Horm: 5.009 u[IU]/mL — ABNORMAL HIGH

## 2015-01-25 LAB — T4, FREE: Free Thyroxine: 1.05 ng/dL

## 2015-02-17 DIAGNOSIS — I5042 Chronic combined systolic (congestive) and diastolic (congestive) heart failure: Secondary | ICD-10-CM | POA: Diagnosis not present

## 2015-04-14 DIAGNOSIS — E784 Other hyperlipidemia: Secondary | ICD-10-CM | POA: Diagnosis not present

## 2015-04-14 DIAGNOSIS — D508 Other iron deficiency anemias: Secondary | ICD-10-CM | POA: Diagnosis not present

## 2015-04-14 DIAGNOSIS — K922 Gastrointestinal hemorrhage, unspecified: Secondary | ICD-10-CM | POA: Diagnosis not present

## 2015-04-14 DIAGNOSIS — G4700 Insomnia, unspecified: Secondary | ICD-10-CM | POA: Diagnosis not present

## 2015-04-14 DIAGNOSIS — I5022 Chronic systolic (congestive) heart failure: Secondary | ICD-10-CM | POA: Diagnosis not present

## 2015-04-14 DIAGNOSIS — I499 Cardiac arrhythmia, unspecified: Secondary | ICD-10-CM | POA: Diagnosis not present

## 2015-04-14 DIAGNOSIS — I129 Hypertensive chronic kidney disease with stage 1 through stage 4 chronic kidney disease, or unspecified chronic kidney disease: Secondary | ICD-10-CM | POA: Diagnosis not present

## 2015-04-14 DIAGNOSIS — E039 Hypothyroidism, unspecified: Secondary | ICD-10-CM | POA: Diagnosis not present

## 2015-04-14 DIAGNOSIS — N529 Male erectile dysfunction, unspecified: Secondary | ICD-10-CM | POA: Diagnosis not present

## 2015-04-14 DIAGNOSIS — N189 Chronic kidney disease, unspecified: Secondary | ICD-10-CM | POA: Diagnosis not present

## 2015-04-14 DIAGNOSIS — D649 Anemia, unspecified: Secondary | ICD-10-CM | POA: Diagnosis not present

## 2015-04-14 DIAGNOSIS — Z95811 Presence of heart assist device: Secondary | ICD-10-CM | POA: Diagnosis not present

## 2015-04-14 DIAGNOSIS — I5042 Chronic combined systolic (congestive) and diastolic (congestive) heart failure: Secondary | ICD-10-CM | POA: Diagnosis not present

## 2015-04-14 DIAGNOSIS — E785 Hyperlipidemia, unspecified: Secondary | ICD-10-CM | POA: Diagnosis not present

## 2015-04-14 DIAGNOSIS — I4891 Unspecified atrial fibrillation: Secondary | ICD-10-CM | POA: Diagnosis not present

## 2015-04-17 DIAGNOSIS — Z48812 Encounter for surgical aftercare following surgery on the circulatory system: Secondary | ICD-10-CM | POA: Diagnosis not present

## 2015-04-17 DIAGNOSIS — Z95811 Presence of heart assist device: Secondary | ICD-10-CM | POA: Diagnosis not present

## 2015-04-17 DIAGNOSIS — I428 Other cardiomyopathies: Secondary | ICD-10-CM | POA: Diagnosis not present

## 2015-04-17 DIAGNOSIS — Z4801 Encounter for change or removal of surgical wound dressing: Secondary | ICD-10-CM | POA: Diagnosis not present

## 2015-05-25 ENCOUNTER — Other Ambulatory Visit
Admission: RE | Admit: 2015-05-25 | Discharge: 2015-05-25 | Disposition: A | Discharge status: Home / Self Care | Payer: Medicare Other | Source: Ambulatory Visit | Attending: Cardiology | Admitting: Cardiology

## 2015-05-25 DIAGNOSIS — Z95811 Presence of heart assist device: Secondary | ICD-10-CM | POA: Diagnosis not present

## 2015-05-25 DIAGNOSIS — I509 Heart failure, unspecified: Secondary | ICD-10-CM | POA: Diagnosis not present

## 2015-05-25 LAB — CBC
HEMATOCRIT: 33.6 % — AB (ref 40.0–52.0)
Hemoglobin: 10.4 g/dL — ABNORMAL LOW (ref 13.0–18.0)
MCH: 23.2 pg — ABNORMAL LOW (ref 26.0–34.0)
MCHC: 31 g/dL — ABNORMAL LOW (ref 32.0–36.0)
MCV: 74.8 fL — ABNORMAL LOW (ref 80.0–100.0)
PLATELETS: 150 10*3/uL (ref 150–440)
RBC: 4.49 MIL/uL (ref 4.40–5.90)
RDW: 14 % (ref 11.5–14.5)
WBC: 3.7 10*3/uL — ABNORMAL LOW (ref 3.8–10.6)

## 2015-05-25 LAB — TSH: TSH: 7.638 u[IU]/mL — ABNORMAL HIGH (ref 0.350–4.500)

## 2015-05-25 LAB — T4, FREE: FREE T4: 1.11 ng/dL (ref 0.61–1.12)

## 2015-07-06 ENCOUNTER — Other Ambulatory Visit
Admission: AD | Admit: 2015-07-06 | Discharge: 2015-07-06 | Disposition: A | Discharge status: Home / Self Care | Payer: Medicare Other | Source: Ambulatory Visit | Attending: Cardiology | Admitting: Cardiology

## 2015-07-06 DIAGNOSIS — Z95811 Presence of heart assist device: Secondary | ICD-10-CM | POA: Diagnosis not present

## 2015-07-06 DIAGNOSIS — I509 Heart failure, unspecified: Secondary | ICD-10-CM | POA: Insufficient documentation

## 2015-07-06 LAB — CBC
HCT: 31.3 % — ABNORMAL LOW (ref 40.0–52.0)
Hemoglobin: 9.7 g/dL — ABNORMAL LOW (ref 13.0–18.0)
MCH: 23.4 pg — ABNORMAL LOW (ref 26.0–34.0)
MCHC: 31.1 g/dL — AB (ref 32.0–36.0)
MCV: 75.1 fL — ABNORMAL LOW (ref 80.0–100.0)
PLATELETS: 161 10*3/uL (ref 150–440)
RBC: 4.17 MIL/uL — ABNORMAL LOW (ref 4.40–5.90)
RDW: 14 % (ref 11.5–14.5)
WBC: 4 10*3/uL (ref 3.8–10.6)

## 2015-07-06 LAB — COMPREHENSIVE METABOLIC PANEL
ALT: 12 U/L — ABNORMAL LOW (ref 17–63)
AST: 22 U/L (ref 15–41)
Albumin: 3.7 g/dL (ref 3.5–5.0)
Alkaline Phosphatase: 46 U/L (ref 38–126)
Anion gap: 8 (ref 5–15)
BUN: 36 mg/dL — ABNORMAL HIGH (ref 6–20)
CHLORIDE: 102 mmol/L (ref 101–111)
CO2: 26 mmol/L (ref 22–32)
Calcium: 9.4 mg/dL (ref 8.9–10.3)
Creatinine, Ser: 2.62 mg/dL — ABNORMAL HIGH (ref 0.61–1.24)
GFR calc non Af Amer: 23 mL/min — ABNORMAL LOW (ref >60)
GFR, EST AFRICAN AMERICAN: 27 mL/min — AB (ref >60)
Glucose, Bld: 118 mg/dL — ABNORMAL HIGH (ref 65–99)
POTASSIUM: 4.2 mmol/L (ref 3.5–5.1)
SODIUM: 136 mmol/L (ref 135–145)
Total Bilirubin: 0.8 mg/dL (ref 0.3–1.2)
Total Protein: 7.4 g/dL (ref 6.5–8.1)

## 2015-07-17 ENCOUNTER — Other Ambulatory Visit
Admission: RE | Admit: 2015-07-17 | Discharge: 2015-07-17 | Disposition: A | Discharge status: Home / Self Care | Payer: Medicare Other | Source: Ambulatory Visit | Attending: Cardiology | Admitting: Cardiology

## 2015-07-17 DIAGNOSIS — I509 Heart failure, unspecified: Secondary | ICD-10-CM | POA: Insufficient documentation

## 2015-07-17 DIAGNOSIS — Z95811 Presence of heart assist device: Secondary | ICD-10-CM | POA: Insufficient documentation

## 2015-07-17 LAB — BASIC METABOLIC PANEL
Anion gap: 5 (ref 5–15)
BUN: 35 mg/dL — AB (ref 6–20)
CHLORIDE: 101 mmol/L (ref 101–111)
CO2: 29 mmol/L (ref 22–32)
Calcium: 9.2 mg/dL (ref 8.9–10.3)
Creatinine, Ser: 2.85 mg/dL — ABNORMAL HIGH (ref 0.61–1.24)
GFR calc Af Amer: 24 mL/min — ABNORMAL LOW (ref >60)
GFR calc non Af Amer: 21 mL/min — ABNORMAL LOW (ref >60)
GLUCOSE: 103 mg/dL — AB (ref 65–99)
Potassium: 4.4 mmol/L (ref 3.5–5.1)
Sodium: 135 mmol/L (ref 135–145)

## 2015-07-17 LAB — T4, FREE: Free T4: 1.1 ng/dL (ref 0.61–1.12)

## 2015-07-17 LAB — TSH: TSH: 4.418 u[IU]/mL (ref 0.350–4.500)

## 2015-07-24 ENCOUNTER — Other Ambulatory Visit
Admission: RE | Admit: 2015-07-24 | Discharge: 2015-07-24 | Disposition: A | Discharge status: Home / Self Care | Payer: Medicare Other | Source: Ambulatory Visit | Attending: Cardiology | Admitting: Cardiology

## 2015-07-24 DIAGNOSIS — I509 Heart failure, unspecified: Secondary | ICD-10-CM | POA: Insufficient documentation

## 2015-07-24 LAB — CBC
HCT: 28.7 % — ABNORMAL LOW (ref 40.0–52.0)
HEMOGLOBIN: 9 g/dL — AB (ref 13.0–18.0)
MCH: 23.8 pg — ABNORMAL LOW (ref 26.0–34.0)
MCHC: 31.2 g/dL — ABNORMAL LOW (ref 32.0–36.0)
MCV: 76.3 fL — AB (ref 80.0–100.0)
Platelets: 168 10*3/uL (ref 150–440)
RBC: 3.77 MIL/uL — AB (ref 4.40–5.90)
RDW: 15.3 % — ABNORMAL HIGH (ref 11.5–14.5)
WBC: 4 10*3/uL (ref 3.8–10.6)

## 2015-07-24 LAB — BASIC METABOLIC PANEL
Anion gap: 7 (ref 5–15)
BUN: 30 mg/dL — ABNORMAL HIGH (ref 6–20)
CHLORIDE: 101 mmol/L (ref 101–111)
CO2: 29 mmol/L (ref 22–32)
CREATININE: 2.62 mg/dL — AB (ref 0.61–1.24)
Calcium: 9.1 mg/dL (ref 8.9–10.3)
GFR calc Af Amer: 27 mL/min — ABNORMAL LOW (ref >60)
GFR calc non Af Amer: 23 mL/min — ABNORMAL LOW (ref >60)
GLUCOSE: 105 mg/dL — AB (ref 65–99)
POTASSIUM: 4.3 mmol/L (ref 3.5–5.1)
Sodium: 137 mmol/L (ref 135–145)

## 2015-07-31 DIAGNOSIS — E860 Dehydration: Secondary | ICD-10-CM | POA: Diagnosis not present

## 2015-07-31 DIAGNOSIS — D500 Iron deficiency anemia secondary to blood loss (chronic): Secondary | ICD-10-CM | POA: Diagnosis not present

## 2015-07-31 DIAGNOSIS — Z95811 Presence of heart assist device: Secondary | ICD-10-CM | POA: Diagnosis not present

## 2015-07-31 DIAGNOSIS — K922 Gastrointestinal hemorrhage, unspecified: Secondary | ICD-10-CM | POA: Diagnosis not present

## 2015-08-07 ENCOUNTER — Other Ambulatory Visit
Admission: RE | Admit: 2015-08-07 | Discharge: 2015-08-07 | Disposition: A | Discharge status: Home / Self Care | Payer: Medicare Other | Source: Ambulatory Visit | Attending: Cardiology | Admitting: Cardiology

## 2015-08-07 DIAGNOSIS — I509 Heart failure, unspecified: Secondary | ICD-10-CM | POA: Insufficient documentation

## 2015-08-07 DIAGNOSIS — Z95811 Presence of heart assist device: Secondary | ICD-10-CM | POA: Diagnosis not present

## 2015-08-07 LAB — BASIC METABOLIC PANEL
Anion gap: 7 (ref 5–15)
BUN: 23 mg/dL — ABNORMAL HIGH (ref 6–20)
CO2: 28 mmol/L (ref 22–32)
Calcium: 9.1 mg/dL (ref 8.9–10.3)
Chloride: 101 mmol/L (ref 101–111)
Creatinine, Ser: 2.72 mg/dL — ABNORMAL HIGH (ref 0.61–1.24)
GFR calc Af Amer: 26 mL/min — ABNORMAL LOW (ref >60)
GFR, EST NON AFRICAN AMERICAN: 22 mL/min — AB (ref >60)
Glucose, Bld: 108 mg/dL — ABNORMAL HIGH (ref 65–99)
POTASSIUM: 4.4 mmol/L (ref 3.5–5.1)
SODIUM: 136 mmol/L (ref 135–145)

## 2015-08-07 LAB — CBC
HCT: 31.2 % — ABNORMAL LOW (ref 40.0–52.0)
HEMOGLOBIN: 9.6 g/dL — AB (ref 13.0–18.0)
MCH: 23.6 pg — ABNORMAL LOW (ref 26.0–34.0)
MCHC: 30.8 g/dL — ABNORMAL LOW (ref 32.0–36.0)
MCV: 76.5 fL — ABNORMAL LOW (ref 80.0–100.0)
PLATELETS: 174 10*3/uL (ref 150–440)
RBC: 4.08 MIL/uL — ABNORMAL LOW (ref 4.40–5.90)
RDW: 15 % — ABNORMAL HIGH (ref 11.5–14.5)
WBC: 3.8 10*3/uL (ref 3.8–10.6)

## 2015-08-17 ENCOUNTER — Other Ambulatory Visit
Admission: RE | Admit: 2015-08-17 | Discharge: 2015-08-17 | Disposition: A | Discharge status: Home / Self Care | Payer: Medicare Other | Source: Ambulatory Visit | Attending: Cardiology | Admitting: Cardiology

## 2015-08-17 DIAGNOSIS — I509 Heart failure, unspecified: Secondary | ICD-10-CM | POA: Diagnosis not present

## 2015-08-17 LAB — CBC
HCT: 25.4 % — ABNORMAL LOW (ref 40.0–52.0)
Hemoglobin: 8 g/dL — ABNORMAL LOW (ref 13.0–18.0)
MCH: 23.8 pg — ABNORMAL LOW (ref 26.0–34.0)
MCHC: 31.3 g/dL — ABNORMAL LOW (ref 32.0–36.0)
MCV: 76.1 fL — ABNORMAL LOW (ref 80.0–100.0)
PLATELETS: 173 10*3/uL (ref 150–440)
RBC: 3.34 MIL/uL — ABNORMAL LOW (ref 4.40–5.90)
RDW: 14.5 % (ref 11.5–14.5)
WBC: 4.4 10*3/uL (ref 3.8–10.6)

## 2015-08-17 LAB — BASIC METABOLIC PANEL
Anion gap: 8 (ref 5–15)
BUN: 46 mg/dL — ABNORMAL HIGH (ref 6–20)
CALCIUM: 9.2 mg/dL (ref 8.9–10.3)
CO2: 25 mmol/L (ref 22–32)
CREATININE: 3.01 mg/dL — AB (ref 0.61–1.24)
Chloride: 102 mmol/L (ref 101–111)
GFR calc Af Amer: 23 mL/min — ABNORMAL LOW (ref >60)
GFR calc non Af Amer: 20 mL/min — ABNORMAL LOW (ref >60)
Glucose, Bld: 103 mg/dL — ABNORMAL HIGH (ref 65–99)
Potassium: 4.8 mmol/L (ref 3.5–5.1)
SODIUM: 135 mmol/L (ref 135–145)

## 2015-08-21 DIAGNOSIS — Z95811 Presence of heart assist device: Secondary | ICD-10-CM | POA: Diagnosis not present

## 2015-08-21 DIAGNOSIS — D500 Iron deficiency anemia secondary to blood loss (chronic): Secondary | ICD-10-CM | POA: Diagnosis not present

## 2015-08-21 DIAGNOSIS — K922 Gastrointestinal hemorrhage, unspecified: Secondary | ICD-10-CM | POA: Diagnosis not present

## 2015-09-04 DIAGNOSIS — R944 Abnormal results of kidney function studies: Secondary | ICD-10-CM | POA: Diagnosis not present

## 2015-09-04 DIAGNOSIS — Z125 Encounter for screening for malignant neoplasm of prostate: Secondary | ICD-10-CM | POA: Diagnosis not present

## 2015-09-04 DIAGNOSIS — D649 Anemia, unspecified: Secondary | ICD-10-CM | POA: Diagnosis not present

## 2015-09-04 DIAGNOSIS — D508 Other iron deficiency anemias: Secondary | ICD-10-CM | POA: Diagnosis not present

## 2015-09-04 DIAGNOSIS — R809 Proteinuria, unspecified: Secondary | ICD-10-CM | POA: Diagnosis not present

## 2015-09-04 DIAGNOSIS — I129 Hypertensive chronic kidney disease with stage 1 through stage 4 chronic kidney disease, or unspecified chronic kidney disease: Secondary | ICD-10-CM | POA: Diagnosis not present

## 2015-09-04 DIAGNOSIS — Z79899 Other long term (current) drug therapy: Secondary | ICD-10-CM | POA: Diagnosis not present

## 2015-09-04 DIAGNOSIS — E032 Hypothyroidism due to medicaments and other exogenous substances: Secondary | ICD-10-CM | POA: Diagnosis not present

## 2015-09-04 DIAGNOSIS — Z131 Encounter for screening for diabetes mellitus: Secondary | ICD-10-CM | POA: Diagnosis not present

## 2015-09-04 DIAGNOSIS — I5022 Chronic systolic (congestive) heart failure: Secondary | ICD-10-CM | POA: Diagnosis not present

## 2015-09-04 DIAGNOSIS — I499 Cardiac arrhythmia, unspecified: Secondary | ICD-10-CM | POA: Diagnosis not present

## 2015-09-04 DIAGNOSIS — N189 Chronic kidney disease, unspecified: Secondary | ICD-10-CM | POA: Diagnosis not present

## 2015-09-04 DIAGNOSIS — Z95811 Presence of heart assist device: Secondary | ICD-10-CM | POA: Diagnosis not present

## 2015-09-04 DIAGNOSIS — E785 Hyperlipidemia, unspecified: Secondary | ICD-10-CM | POA: Diagnosis not present

## 2015-09-04 DIAGNOSIS — N521 Erectile dysfunction due to diseases classified elsewhere: Secondary | ICD-10-CM | POA: Diagnosis not present

## 2015-09-06 DIAGNOSIS — Z4502 Encounter for adjustment and management of automatic implantable cardiac defibrillator: Secondary | ICD-10-CM | POA: Diagnosis not present

## 2015-09-06 DIAGNOSIS — I5042 Chronic combined systolic (congestive) and diastolic (congestive) heart failure: Secondary | ICD-10-CM | POA: Diagnosis not present

## 2015-09-27 ENCOUNTER — Other Ambulatory Visit
Admission: RE | Admit: 2015-09-27 | Discharge: 2015-09-27 | Disposition: A | Discharge status: Home / Self Care | Payer: Medicare Other | Source: Ambulatory Visit | Attending: Cardiology | Admitting: Cardiology

## 2015-09-27 DIAGNOSIS — I509 Heart failure, unspecified: Secondary | ICD-10-CM | POA: Diagnosis not present

## 2015-09-27 DIAGNOSIS — Z95811 Presence of heart assist device: Secondary | ICD-10-CM | POA: Insufficient documentation

## 2015-09-27 LAB — CBC WITH DIFFERENTIAL/PLATELET
Basophils Absolute: 0 10*3/uL (ref 0–0.1)
Basophils Relative: 1 %
EOS ABS: 0.2 10*3/uL (ref 0–0.7)
Eosinophils Relative: 4 %
HEMATOCRIT: 34.9 % — AB (ref 40.0–52.0)
HEMOGLOBIN: 10.9 g/dL — AB (ref 13.0–18.0)
LYMPHS ABS: 0.8 10*3/uL — AB (ref 1.0–3.6)
Lymphocytes Relative: 20 %
MCH: 24.1 pg — ABNORMAL LOW (ref 26.0–34.0)
MCHC: 31.2 g/dL — AB (ref 32.0–36.0)
MCV: 77.3 fL — ABNORMAL LOW (ref 80.0–100.0)
MONOS PCT: 16 %
Monocytes Absolute: 0.6 10*3/uL (ref 0.2–1.0)
NEUTROS ABS: 2.4 10*3/uL (ref 1.4–6.5)
Neutrophils Relative %: 59 %
Platelets: 170 10*3/uL (ref 150–440)
RBC: 4.52 MIL/uL (ref 4.40–5.90)
RDW: 14 % (ref 11.5–14.5)
WBC: 4 10*3/uL (ref 3.8–10.6)

## 2015-09-27 LAB — BASIC METABOLIC PANEL
Anion gap: 9 (ref 5–15)
BUN: 33 mg/dL — AB (ref 6–20)
CHLORIDE: 101 mmol/L (ref 101–111)
CO2: 25 mmol/L (ref 22–32)
CREATININE: 2.79 mg/dL — AB (ref 0.61–1.24)
Calcium: 8.7 mg/dL — ABNORMAL LOW (ref 8.9–10.3)
GFR calc Af Amer: 25 mL/min — ABNORMAL LOW (ref >60)
GFR calc non Af Amer: 21 mL/min — ABNORMAL LOW (ref >60)
Glucose, Bld: 109 mg/dL — ABNORMAL HIGH (ref 65–99)
Potassium: 4.9 mmol/L (ref 3.5–5.1)
Sodium: 135 mmol/L (ref 135–145)

## 2015-10-04 ENCOUNTER — Other Ambulatory Visit
Admission: RE | Admit: 2015-10-04 | Discharge: 2015-10-04 | Disposition: A | Discharge status: Home / Self Care | Payer: Medicare Other | Source: Ambulatory Visit | Attending: Cardiology | Admitting: Cardiology

## 2015-10-04 DIAGNOSIS — I509 Heart failure, unspecified: Secondary | ICD-10-CM | POA: Insufficient documentation

## 2015-10-04 DIAGNOSIS — Z95811 Presence of heart assist device: Secondary | ICD-10-CM | POA: Diagnosis not present

## 2015-10-04 LAB — CBC
HCT: 33.3 % — ABNORMAL LOW (ref 40.0–52.0)
HEMOGLOBIN: 10.5 g/dL — AB (ref 13.0–18.0)
MCH: 24.2 pg — ABNORMAL LOW (ref 26.0–34.0)
MCHC: 31.6 g/dL — ABNORMAL LOW (ref 32.0–36.0)
MCV: 76.7 fL — AB (ref 80.0–100.0)
Platelets: 169 10*3/uL (ref 150–440)
RBC: 4.34 MIL/uL — AB (ref 4.40–5.90)
RDW: 13.6 % (ref 11.5–14.5)
WBC: 4.1 10*3/uL (ref 3.8–10.6)

## 2015-10-04 LAB — BASIC METABOLIC PANEL
Anion gap: 6 (ref 5–15)
BUN: 31 mg/dL — ABNORMAL HIGH (ref 6–20)
CHLORIDE: 102 mmol/L (ref 101–111)
CO2: 28 mmol/L (ref 22–32)
Calcium: 9.5 mg/dL (ref 8.9–10.3)
Creatinine, Ser: 2.43 mg/dL — ABNORMAL HIGH (ref 0.61–1.24)
GFR calc Af Amer: 29 mL/min — ABNORMAL LOW (ref >60)
GFR, EST NON AFRICAN AMERICAN: 25 mL/min — AB (ref >60)
GLUCOSE: 91 mg/dL (ref 65–99)
POTASSIUM: 5.1 mmol/L (ref 3.5–5.1)
SODIUM: 136 mmol/L (ref 135–145)

## 2015-10-25 ENCOUNTER — Other Ambulatory Visit
Admission: RE | Admit: 2015-10-25 | Discharge: 2015-10-25 | Disposition: A | Discharge status: Home / Self Care | Payer: Medicare Other | Source: Ambulatory Visit | Attending: Cardiology | Admitting: Cardiology

## 2015-10-25 DIAGNOSIS — Z95811 Presence of heart assist device: Secondary | ICD-10-CM | POA: Insufficient documentation

## 2015-10-25 DIAGNOSIS — I509 Heart failure, unspecified: Secondary | ICD-10-CM | POA: Insufficient documentation

## 2015-10-25 LAB — BASIC METABOLIC PANEL
ANION GAP: 5 (ref 5–15)
BUN: 30 mg/dL — AB (ref 6–20)
CALCIUM: 8.8 mg/dL — AB (ref 8.9–10.3)
CHLORIDE: 99 mmol/L — AB (ref 101–111)
CO2: 27 mmol/L (ref 22–32)
Creatinine, Ser: 2.43 mg/dL — ABNORMAL HIGH (ref 0.61–1.24)
GFR calc Af Amer: 29 mL/min — ABNORMAL LOW (ref >60)
GFR, EST NON AFRICAN AMERICAN: 25 mL/min — AB (ref >60)
GLUCOSE: 139 mg/dL — AB (ref 65–99)
POTASSIUM: 4.2 mmol/L (ref 3.5–5.1)
Sodium: 131 mmol/L — ABNORMAL LOW (ref 135–145)

## 2015-10-25 LAB — CBC
HEMATOCRIT: 32.8 % — AB (ref 40.0–52.0)
HEMOGLOBIN: 10.3 g/dL — AB (ref 13.0–18.0)
MCH: 23.8 pg — AB (ref 26.0–34.0)
MCHC: 31.4 g/dL — ABNORMAL LOW (ref 32.0–36.0)
MCV: 75.8 fL — AB (ref 80.0–100.0)
Platelets: 178 10*3/uL (ref 150–440)
RBC: 4.33 MIL/uL — AB (ref 4.40–5.90)
RDW: 13.4 % (ref 11.5–14.5)
WBC: 5.2 10*3/uL (ref 3.8–10.6)

## 2015-10-25 LAB — TSH: TSH: 6.902 u[IU]/mL — AB (ref 0.350–4.500)

## 2015-10-25 LAB — T4, FREE: FREE T4: 1.07 ng/dL (ref 0.61–1.12)

## 2015-12-06 ENCOUNTER — Other Ambulatory Visit
Admission: RE | Admit: 2015-12-06 | Discharge: 2015-12-06 | Disposition: A | Discharge status: Home / Self Care | Payer: Medicare Other | Source: Ambulatory Visit | Attending: Cardiology | Admitting: Cardiology

## 2015-12-06 DIAGNOSIS — I5042 Chronic combined systolic (congestive) and diastolic (congestive) heart failure: Secondary | ICD-10-CM | POA: Diagnosis not present

## 2015-12-06 DIAGNOSIS — Z95811 Presence of heart assist device: Secondary | ICD-10-CM | POA: Insufficient documentation

## 2015-12-06 DIAGNOSIS — I509 Heart failure, unspecified: Secondary | ICD-10-CM | POA: Insufficient documentation

## 2015-12-06 DIAGNOSIS — Z4502 Encounter for adjustment and management of automatic implantable cardiac defibrillator: Secondary | ICD-10-CM | POA: Diagnosis not present

## 2015-12-06 LAB — CBC
HCT: 31.2 % — ABNORMAL LOW (ref 40.0–52.0)
HEMOGLOBIN: 9.6 g/dL — AB (ref 13.0–18.0)
MCH: 23.6 pg — AB (ref 26.0–34.0)
MCHC: 30.9 g/dL — AB (ref 32.0–36.0)
MCV: 76.5 fL — ABNORMAL LOW (ref 80.0–100.0)
PLATELETS: 163 10*3/uL (ref 150–440)
RBC: 4.08 MIL/uL — AB (ref 4.40–5.90)
RDW: 15.1 % — ABNORMAL HIGH (ref 11.5–14.5)
WBC: 3.5 10*3/uL — ABNORMAL LOW (ref 3.8–10.6)

## 2015-12-06 LAB — TSH: TSH: 3.7 u[IU]/mL (ref 0.350–4.500)

## 2015-12-06 LAB — BASIC METABOLIC PANEL
Anion gap: 7 (ref 5–15)
BUN: 25 mg/dL — AB (ref 6–20)
CALCIUM: 9.2 mg/dL (ref 8.9–10.3)
CHLORIDE: 101 mmol/L (ref 101–111)
CO2: 26 mmol/L (ref 22–32)
CREATININE: 2.41 mg/dL — AB (ref 0.61–1.24)
GFR, EST AFRICAN AMERICAN: 29 mL/min — AB (ref >60)
GFR, EST NON AFRICAN AMERICAN: 25 mL/min — AB (ref >60)
Glucose, Bld: 116 mg/dL — ABNORMAL HIGH (ref 65–99)
Potassium: 4.8 mmol/L (ref 3.5–5.1)
SODIUM: 134 mmol/L — AB (ref 135–145)

## 2015-12-06 LAB — T4, FREE: FREE T4: 1.22 ng/dL — AB (ref 0.61–1.12)

## 2015-12-12 DIAGNOSIS — I129 Hypertensive chronic kidney disease with stage 1 through stage 4 chronic kidney disease, or unspecified chronic kidney disease: Secondary | ICD-10-CM | POA: Diagnosis not present

## 2015-12-12 DIAGNOSIS — Z888 Allergy status to other drugs, medicaments and biological substances status: Secondary | ICD-10-CM | POA: Diagnosis not present

## 2015-12-12 DIAGNOSIS — E039 Hypothyroidism, unspecified: Secondary | ICD-10-CM | POA: Diagnosis not present

## 2015-12-12 DIAGNOSIS — I4891 Unspecified atrial fibrillation: Secondary | ICD-10-CM | POA: Diagnosis not present

## 2015-12-12 DIAGNOSIS — Z4502 Encounter for adjustment and management of automatic implantable cardiac defibrillator: Secondary | ICD-10-CM | POA: Diagnosis not present

## 2015-12-12 DIAGNOSIS — I480 Paroxysmal atrial fibrillation: Secondary | ICD-10-CM | POA: Diagnosis not present

## 2015-12-12 DIAGNOSIS — I5022 Chronic systolic (congestive) heart failure: Secondary | ICD-10-CM | POA: Diagnosis not present

## 2015-12-12 DIAGNOSIS — K219 Gastro-esophageal reflux disease without esophagitis: Secondary | ICD-10-CM | POA: Diagnosis not present

## 2015-12-12 DIAGNOSIS — Z95810 Presence of automatic (implantable) cardiac defibrillator: Secondary | ICD-10-CM | POA: Diagnosis not present

## 2015-12-12 DIAGNOSIS — Z886 Allergy status to analgesic agent status: Secondary | ICD-10-CM | POA: Diagnosis not present

## 2015-12-12 DIAGNOSIS — Z79899 Other long term (current) drug therapy: Secondary | ICD-10-CM | POA: Diagnosis not present

## 2015-12-12 DIAGNOSIS — N189 Chronic kidney disease, unspecified: Secondary | ICD-10-CM | POA: Diagnosis not present

## 2015-12-22 DIAGNOSIS — Z95811 Presence of heart assist device: Secondary | ICD-10-CM | POA: Diagnosis not present

## 2015-12-22 DIAGNOSIS — I428 Other cardiomyopathies: Secondary | ICD-10-CM | POA: Diagnosis not present

## 2015-12-22 DIAGNOSIS — Z4801 Encounter for change or removal of surgical wound dressing: Secondary | ICD-10-CM | POA: Diagnosis not present

## 2015-12-22 DIAGNOSIS — Z48812 Encounter for surgical aftercare following surgery on the circulatory system: Secondary | ICD-10-CM | POA: Diagnosis not present

## 2016-01-12 ENCOUNTER — Other Ambulatory Visit: Payer: Self-pay | Admitting: *Deleted

## 2016-01-12 DIAGNOSIS — D649 Anemia, unspecified: Secondary | ICD-10-CM | POA: Diagnosis not present

## 2016-01-12 DIAGNOSIS — K922 Gastrointestinal hemorrhage, unspecified: Secondary | ICD-10-CM | POA: Diagnosis not present

## 2016-01-12 DIAGNOSIS — N189 Chronic kidney disease, unspecified: Secondary | ICD-10-CM | POA: Diagnosis not present

## 2016-01-12 DIAGNOSIS — R809 Proteinuria, unspecified: Secondary | ICD-10-CM | POA: Diagnosis not present

## 2016-01-12 DIAGNOSIS — I129 Hypertensive chronic kidney disease with stage 1 through stage 4 chronic kidney disease, or unspecified chronic kidney disease: Secondary | ICD-10-CM | POA: Diagnosis not present

## 2016-01-12 DIAGNOSIS — Z87891 Personal history of nicotine dependence: Secondary | ICD-10-CM | POA: Diagnosis not present

## 2016-01-12 DIAGNOSIS — K219 Gastro-esophageal reflux disease without esophagitis: Secondary | ICD-10-CM | POA: Diagnosis not present

## 2016-01-12 DIAGNOSIS — D508 Other iron deficiency anemias: Secondary | ICD-10-CM | POA: Diagnosis not present

## 2016-01-12 DIAGNOSIS — Z95811 Presence of heart assist device: Secondary | ICD-10-CM | POA: Diagnosis not present

## 2016-01-12 DIAGNOSIS — Z79899 Other long term (current) drug therapy: Secondary | ICD-10-CM | POA: Diagnosis not present

## 2016-01-12 DIAGNOSIS — N183 Chronic kidney disease, stage 3 (moderate): Secondary | ICD-10-CM | POA: Diagnosis not present

## 2016-01-12 DIAGNOSIS — I5022 Chronic systolic (congestive) heart failure: Secondary | ICD-10-CM | POA: Diagnosis not present

## 2016-01-12 DIAGNOSIS — E785 Hyperlipidemia, unspecified: Secondary | ICD-10-CM | POA: Diagnosis not present

## 2016-01-12 DIAGNOSIS — Z95810 Presence of automatic (implantable) cardiac defibrillator: Secondary | ICD-10-CM | POA: Diagnosis not present

## 2016-01-12 DIAGNOSIS — E032 Hypothyroidism due to medicaments and other exogenous substances: Secondary | ICD-10-CM | POA: Diagnosis not present

## 2016-01-12 DIAGNOSIS — E039 Hypothyroidism, unspecified: Secondary | ICD-10-CM | POA: Diagnosis not present

## 2016-01-12 NOTE — Patient Outreach (Signed)
[facility]) Care Management  01/12/2016  Ryan Mcdonald 04/28/1946 [id number]   Referral from [facility] CHF High Risk list:  Telephone call to patient; left message on voice mail requesting call back.  Plan: Will follow up. Sherrin Daisy, RN BSN [place] Management Coordinator [facility] Care Management  [phone]

## 2016-01-16 ENCOUNTER — Other Ambulatory Visit: Payer: Self-pay | Admitting: *Deleted

## 2016-01-16 NOTE — Patient Outreach (Signed)
[place] [facility] [facility]) Care Management  01/16/2016  Gifford Enge [birth year] [id number]   Telephone call x 2 to patient; left message on voice mail requesting call back.  Plan: will follow up. Sherrin Daisy, RN BSN [place] Management Coordinator [facility] Care Management  [phone]

## 2016-01-18 ENCOUNTER — Other Ambulatory Visit: Payer: Self-pay | Admitting: *Deleted

## 2016-01-18 NOTE — Patient Outreach (Signed)
[place] [facility]) Care Management  01/18/2016  Keoki Lanoue 1946/04/07 [id number]   Telephone call to patient who was advised of reason for call. States he is unable to talk now because he is working & driving. States he would be able to talk with me tomorrow.     Plan:  Will follow up with patient. Patient agrees with set appointment.  Sherrin Daisy, RN BSN [place] Management Coordinator [facility] Care Management  [phone]

## 2016-01-19 ENCOUNTER — Ambulatory Visit: Payer: Self-pay | Admitting: *Deleted

## 2016-01-23 ENCOUNTER — Other Ambulatory Visit: Payer: Self-pay | Admitting: *Deleted

## 2016-01-23 ENCOUNTER — Encounter: Payer: Self-pay | Admitting: *Deleted

## 2016-01-23 NOTE — Patient Outreach (Signed)
[place] [facility]) Care Management  01/23/2016  Ryan Mcdonald 09/01/1946 [id number]   Telephone call to patient; left message on voice mail requesting call back.  Plan: Send outreach letter Will follow up.  Sherrin Daisy, RN BSN [place] Management Coordinator [facility] Care Management  [phone]

## 2016-02-07 ENCOUNTER — Other Ambulatory Visit: Payer: Self-pay | Admitting: *Deleted

## 2016-02-07 ENCOUNTER — Encounter: Payer: Self-pay | Admitting: *Deleted

## 2016-02-07 NOTE — Patient Outreach (Signed)
[place] [facility]) Care Management  02/07/2016  Ryan Mcdonald 09-13-1946 [id number]   Unsuccessful call attempts; no response from patient to outreach letter.  Plan: Send MD closure letter. Close case.  Sherrin Daisy, RN BSN [place] Management Coordinator [facility] Care Management  [phone]

## 2016-02-09 DIAGNOSIS — I5022 Chronic systolic (congestive) heart failure: Secondary | ICD-10-CM | POA: Diagnosis not present

## 2016-02-23 DIAGNOSIS — E039 Hypothyroidism, unspecified: Secondary | ICD-10-CM | POA: Diagnosis not present

## 2016-02-23 DIAGNOSIS — I5022 Chronic systolic (congestive) heart failure: Secondary | ICD-10-CM | POA: Diagnosis not present

## 2016-02-23 DIAGNOSIS — Z79899 Other long term (current) drug therapy: Secondary | ICD-10-CM | POA: Diagnosis not present

## 2016-03-05 DIAGNOSIS — I428 Other cardiomyopathies: Secondary | ICD-10-CM | POA: Diagnosis not present

## 2016-03-05 DIAGNOSIS — Z4801 Encounter for change or removal of surgical wound dressing: Secondary | ICD-10-CM | POA: Diagnosis not present

## 2016-03-05 DIAGNOSIS — Z95811 Presence of heart assist device: Secondary | ICD-10-CM | POA: Diagnosis not present

## 2016-03-05 DIAGNOSIS — Z48812 Encounter for surgical aftercare following surgery on the circulatory system: Secondary | ICD-10-CM | POA: Diagnosis not present

## 2016-03-06 DIAGNOSIS — I5042 Chronic combined systolic (congestive) and diastolic (congestive) heart failure: Secondary | ICD-10-CM | POA: Diagnosis not present

## 2016-03-06 DIAGNOSIS — Z45018 Encounter for adjustment and management of other part of cardiac pacemaker: Secondary | ICD-10-CM | POA: Diagnosis not present

## 2016-03-22 DIAGNOSIS — I509 Heart failure, unspecified: Secondary | ICD-10-CM | POA: Diagnosis not present

## 2016-03-22 DIAGNOSIS — I5022 Chronic systolic (congestive) heart failure: Secondary | ICD-10-CM | POA: Diagnosis not present

## 2016-03-22 DIAGNOSIS — I447 Left bundle-branch block, unspecified: Secondary | ICD-10-CM | POA: Diagnosis not present

## 2016-03-22 DIAGNOSIS — Z006 Encounter for examination for normal comparison and control in clinical research program: Secondary | ICD-10-CM | POA: Diagnosis not present

## 2016-03-22 DIAGNOSIS — I4892 Unspecified atrial flutter: Secondary | ICD-10-CM | POA: Diagnosis not present

## 2016-03-22 DIAGNOSIS — Z950 Presence of cardiac pacemaker: Secondary | ICD-10-CM | POA: Diagnosis not present

## 2016-03-22 DIAGNOSIS — I4430 Unspecified atrioventricular block: Secondary | ICD-10-CM | POA: Diagnosis not present

## 2016-03-22 DIAGNOSIS — R9431 Abnormal electrocardiogram [ECG] [EKG]: Secondary | ICD-10-CM | POA: Diagnosis not present

## 2016-03-22 DIAGNOSIS — Z01810 Encounter for preprocedural cardiovascular examination: Secondary | ICD-10-CM | POA: Diagnosis not present

## 2016-03-22 DIAGNOSIS — Z4502 Encounter for adjustment and management of automatic implantable cardiac defibrillator: Secondary | ICD-10-CM | POA: Diagnosis not present

## 2016-03-22 DIAGNOSIS — I429 Cardiomyopathy, unspecified: Secondary | ICD-10-CM | POA: Diagnosis not present

## 2016-04-02 DIAGNOSIS — I5042 Chronic combined systolic (congestive) and diastolic (congestive) heart failure: Secondary | ICD-10-CM | POA: Diagnosis not present

## 2016-04-19 DIAGNOSIS — N183 Chronic kidney disease, stage 3 (moderate): Secondary | ICD-10-CM | POA: Diagnosis not present

## 2016-04-19 DIAGNOSIS — I5022 Chronic systolic (congestive) heart failure: Secondary | ICD-10-CM | POA: Diagnosis not present

## 2016-04-19 DIAGNOSIS — Z95811 Presence of heart assist device: Secondary | ICD-10-CM | POA: Diagnosis not present

## 2016-04-19 DIAGNOSIS — E032 Hypothyroidism due to medicaments and other exogenous substances: Secondary | ICD-10-CM | POA: Diagnosis not present

## 2016-04-19 DIAGNOSIS — D508 Other iron deficiency anemias: Secondary | ICD-10-CM | POA: Diagnosis not present

## 2016-06-11 ENCOUNTER — Ambulatory Visit
Admission: EM | Admit: 2016-06-11 | Discharge: 2016-06-11 | Disposition: A | Discharge status: Home / Self Care | Payer: Medicare Other | Attending: Family Medicine | Admitting: Family Medicine

## 2016-06-11 DIAGNOSIS — R040 Epistaxis: Secondary | ICD-10-CM | POA: Diagnosis not present

## 2016-06-11 MED ORDER — FEXOFENADINE-PSEUDOEPHED ER 180-240 MG PO TB24: 1.0000 | ORAL_TABLET | Freq: Every day | ORAL | 0 refills | Status: AC

## 2016-06-11 NOTE — ED Triage Notes (Signed)
Patient had a nosebleed that started yesterday and has continued to bleed since.  Bright red blood present, currently has a guaze placed in left nostril. Denies any medications that would cause this

## 2016-06-11 NOTE — ED Provider Notes (Signed)
[facility]    CSN: [id number] Arrival date & time: 06/11/16  1338  First Provider Contact:  First MD Initiated Contact with Patient 06/11/16 1448        History   Chief Complaint Chief Complaint  Patient presents with  . Epistaxis    HPI Ryan Mcdonald is a 70 y.o. male.   The history is provided by the patient. No language interpreter was used.  Epistaxis  Location:  L nare Severity:  Moderate Duration:  2 days Timing:  Constant Progression:  Waxing and waning Chronicity:  New Context: not anticoagulants, not aspirin use, not drug use, not nose picking and not recent infection   Relieved by:  Nothing Worsened by:  Nothing Associated symptoms: congestion and sneezing   Associated symptoms: no headaches   Risk factors: no frequent nosebleeds     History reviewed. No pertinent past medical history.  There are no active problems to display for this patient.   History reviewed. No pertinent surgical history.     Home Medications    Prior to Admission medications   Medication Sig Start Date End Date Taking? Authorizing Provider  amiodarone (PACERONE) 200 MG tablet Take 200 mg by mouth daily.   Yes Historical Provider, MD  cholecalciferol (VITAMIN D) 400 units TABS tablet Take 2,000 Units by mouth.   Yes Historical Provider, MD  digoxin (LANOXIN) 0.125 MG tablet Take by mouth daily.   Yes Historical Provider, MD  docusate sodium (COLACE) 100 MG capsule Take 100 mg by mouth 2 (two) times daily.   Yes Historical Provider, MD  ferrous sulfate 325 (65 FE) MG tablet Take 325 mg by mouth 2 (two) times daily with a meal.   Yes Historical Provider, MD  levothyroxine (SYNTHROID, LEVOTHROID) 25 MCG tablet Take 25 mcg by mouth daily before breakfast.   Yes Historical Provider, MD  magnesium oxide (MAG-OX) 400 MG tablet Take 400 mg by mouth daily.   Yes Historical Provider, MD  pantoprazole (PROTONIX) 40 MG tablet Take 40 mg by mouth daily.   Yes  Historical Provider, MD  sildenafil (VIAGRA) 50 MG tablet Take 50 mg by mouth daily as needed for erectile dysfunction.   Yes Historical Provider, MD  fexofenadine-pseudoephedrine (ALLEGRA-D ALLERGY & CONGESTION) 180-240 MG 24 hr tablet Take 1 tablet by mouth daily. 06/11/16   Frederich Cha, MD    Family History History reviewed. No pertinent family history.  Social History Social History  Substance Use Topics  . Smoking status: Never Smoker  . Smokeless tobacco: Never Used  . Alcohol use No     Allergies   Review of patient's allergies indicates no known allergies.   Review of Systems Review of Systems  HENT: Positive for congestion, nosebleeds and sneezing.   Neurological: Negative for headaches.  All other systems reviewed and are negative.    Physical Exam Triage Vital Signs ED Triage Vitals  Enc Vitals Group     BP 06/11/16 1350 129/85     Pulse Rate 06/11/16 1350 67     Resp 06/11/16 1350 18     Temp 06/11/16 1350 97.7 F (36.5 C)     Temp Source 06/11/16 1350 Oral     SpO2 06/11/16 1350 100 %     Weight 06/11/16 1350 171 lb (77.6 kg)     Height 06/11/16 1350 6\' 2"  (1.88 m)     Head Circumference --      Peak Flow --      Pain Score 06/11/16  1352 0     Pain Loc --      Pain Edu? --      Excl. in [place]? --    No data found.   Updated Vital Signs BP 129/85 (BP Location: Left Arm)   Pulse 67   Temp 97.7 F (36.5 C) (Oral)   Resp 18   Ht 6\' 2"  (1.88 m)   Wt 171 lb (77.6 kg)   SpO2 100%   BMI 21.96 kg/m   Visual Acuity Right Eye Distance:   Left Eye Distance:   Bilateral Distance:    Right Eye Near:   Left Eye Near:    Bilateral Near:     Physical Exam  Constitutional: He appears well-developed and well-nourished. No distress.  HENT:  Head: Normocephalic.  Nose: No nose lacerations or sinus tenderness. Epistaxis is observed.  No foreign bodies. Right sinus exhibits no maxillary sinus tenderness and no frontal sinus tenderness. Left sinus  exhibits no maxillary sinus tenderness and no frontal sinus tenderness.    Eyes: Pupils are equal, round, and reactive to light.  Neck: Normal range of motion.  Musculoskeletal: Normal range of motion.  Neurological: He is alert.  Skin: Skin is warm. He is not diaphoretic.  Psychiatric: He has a normal mood and affect.  Vitals reviewed.    UC Treatments / Results  Labs (all labs ordered are listed, but only abnormal results are displayed) Labs Reviewed - No data to display  EKG  EKG Interpretation None       Radiology No results found.  Procedures .Epistaxis Management Date/Time: 06/11/2016 3:36 PM Performed by: Frederich Cha Authorized by: Frederich Cha   Consent:    Consent obtained:  Verbal Anesthesia (see MAR for exact dosages):    Anesthesia method:  None Procedure details:    Treatment site:  L anterior   Treatment method:  Silver nitrate   Treatment complexity:  Limited   Treatment episode: initial   Post-procedure details:    Assessment:  Bleeding stopped   Patient tolerance of procedure:  Tolerated well, no immediate complications    (including critical care time)  Medications Ordered in UC Medications - No data to display   Initial Impression / Assessment and Plan / UC Course  I have reviewed the triage vital signs and the nursing notes.  Pertinent labs & imaging results that were available during my care of the patient were reviewed by me and considered in my medical decision making (see chart for details).  Clinical Course    Patient did well after the epistaxis was controlled with silver nitrate. We'll place him on Allegra-D 24 hours 1 tablet day and follow-up with ENT or his PCP if not better in the next 1-3 days.   Final Clinical Impressions(s) / UC Diagnoses   Final diagnoses:  Acute anterior epistaxis  Left-sided epistaxis    New Prescriptions New Prescriptions   FEXOFENADINE-PSEUDOEPHEDRINE (ALLEGRA-D ALLERGY & CONGESTION)  180-240 MG 24 HR TABLET    Take 1 tablet by mouth daily.     Frederich Cha, MD 06/11/16 [phone]

## 2016-06-13 ENCOUNTER — Other Ambulatory Visit
Admission: RE | Admit: 2016-06-13 | Discharge: 2016-06-13 | Disposition: A | Discharge status: Home / Self Care | Payer: Medicare Other | Source: Ambulatory Visit | Attending: Cardiology | Admitting: Cardiology

## 2016-06-13 DIAGNOSIS — Z95811 Presence of heart assist device: Secondary | ICD-10-CM | POA: Diagnosis not present

## 2016-06-13 DIAGNOSIS — I509 Heart failure, unspecified: Secondary | ICD-10-CM | POA: Diagnosis not present

## 2016-06-13 LAB — BASIC METABOLIC PANEL
Anion gap: 7 (ref 5–15)
BUN: 28 mg/dL — AB (ref 6–20)
CHLORIDE: 102 mmol/L (ref 101–111)
CO2: 26 mmol/L (ref 22–32)
CREATININE: 2.56 mg/dL — AB (ref 0.61–1.24)
Calcium: 8.9 mg/dL (ref 8.9–10.3)
GFR calc Af Amer: 27 mL/min — ABNORMAL LOW (ref >60)
GFR calc non Af Amer: 24 mL/min — ABNORMAL LOW (ref >60)
GLUCOSE: 101 mg/dL — AB (ref 65–99)
Potassium: 4.2 mmol/L (ref 3.5–5.1)
Sodium: 135 mmol/L (ref 135–145)

## 2016-06-13 LAB — CBC WITH DIFFERENTIAL/PLATELET
Basophils Absolute: 0 10*3/uL (ref 0–0.1)
Basophils Relative: 1 %
EOS ABS: 0.1 10*3/uL (ref 0–0.7)
Eosinophils Relative: 3 %
HEMATOCRIT: 29.2 % — AB (ref 40.0–52.0)
HEMOGLOBIN: 9 g/dL — AB (ref 13.0–18.0)
LYMPHS ABS: 0.5 10*3/uL — AB (ref 1.0–3.6)
LYMPHS PCT: 16 %
MCH: 24.2 pg — AB (ref 26.0–34.0)
MCHC: 30.9 g/dL — AB (ref 32.0–36.0)
MCV: 78.2 fL — AB (ref 80.0–100.0)
MONOS PCT: 17 %
Monocytes Absolute: 0.6 10*3/uL (ref 0.2–1.0)
NEUTROS ABS: 2.1 10*3/uL (ref 1.4–6.5)
NEUTROS PCT: 63 %
Platelets: 167 10*3/uL (ref 150–440)
RBC: 3.73 MIL/uL — ABNORMAL LOW (ref 4.40–5.90)
RDW: 14.5 % (ref 11.5–14.5)
WBC: 3.4 10*3/uL — ABNORMAL LOW (ref 3.8–10.6)

## 2016-06-25 DIAGNOSIS — I5042 Chronic combined systolic (congestive) and diastolic (congestive) heart failure: Secondary | ICD-10-CM | POA: Diagnosis not present

## 2016-06-25 DIAGNOSIS — Z45018 Encounter for adjustment and management of other part of cardiac pacemaker: Secondary | ICD-10-CM | POA: Diagnosis not present

## 2016-06-27 ENCOUNTER — Other Ambulatory Visit
Admission: RE | Admit: 2016-06-27 | Discharge: 2016-06-27 | Disposition: A | Discharge status: Home / Self Care | Payer: Medicare Other | Source: Ambulatory Visit | Attending: Cardiology | Admitting: Cardiology

## 2016-06-27 DIAGNOSIS — Z95811 Presence of heart assist device: Secondary | ICD-10-CM | POA: Insufficient documentation

## 2016-06-27 DIAGNOSIS — I509 Heart failure, unspecified: Secondary | ICD-10-CM | POA: Insufficient documentation

## 2016-06-27 LAB — CBC
HEMATOCRIT: 29.3 % — AB (ref 40.0–52.0)
HEMOGLOBIN: 9 g/dL — AB (ref 13.0–18.0)
MCH: 24.2 pg — ABNORMAL LOW (ref 26.0–34.0)
MCHC: 30.8 g/dL — ABNORMAL LOW (ref 32.0–36.0)
MCV: 78.5 fL — ABNORMAL LOW (ref 80.0–100.0)
Platelets: 173 10*3/uL (ref 150–440)
RBC: 3.73 MIL/uL — ABNORMAL LOW (ref 4.40–5.90)
RDW: 14 % (ref 11.5–14.5)
WBC: 3.6 10*3/uL — AB (ref 3.8–10.6)

## 2016-06-27 LAB — DIGOXIN LEVEL: Digoxin Level: 0.5 ng/mL — ABNORMAL LOW (ref 0.8–2.0)

## 2016-09-02 ENCOUNTER — Other Ambulatory Visit
Admission: RE | Admit: 2016-09-02 | Discharge: 2016-09-02 | Disposition: A | Discharge status: Home / Self Care | Payer: Medicare Other | Source: Ambulatory Visit | Attending: Cardiology | Admitting: Cardiology

## 2016-09-02 DIAGNOSIS — Z95811 Presence of heart assist device: Secondary | ICD-10-CM | POA: Diagnosis not present

## 2016-09-02 DIAGNOSIS — I509 Heart failure, unspecified: Secondary | ICD-10-CM | POA: Insufficient documentation

## 2016-09-02 LAB — CBC WITH DIFFERENTIAL/PLATELET
Basophils Absolute: 0 10*3/uL (ref 0–0.1)
Basophils Relative: 1 %
EOS ABS: 0.1 10*3/uL (ref 0–0.7)
Eosinophils Relative: 3 %
HEMATOCRIT: 31.3 % — AB (ref 40.0–52.0)
HEMOGLOBIN: 9.8 g/dL — AB (ref 13.0–18.0)
LYMPHS ABS: 0.7 10*3/uL — AB (ref 1.0–3.6)
Lymphocytes Relative: 23 %
MCH: 23.8 pg — AB (ref 26.0–34.0)
MCHC: 31.4 g/dL — AB (ref 32.0–36.0)
MCV: 75.9 fL — ABNORMAL LOW (ref 80.0–100.0)
MONOS PCT: 17 %
Monocytes Absolute: 0.5 10*3/uL (ref 0.2–1.0)
NEUTROS ABS: 1.8 10*3/uL (ref 1.4–6.5)
NEUTROS PCT: 56 %
Platelets: 141 10*3/uL — ABNORMAL LOW (ref 150–440)
RBC: 4.13 MIL/uL — ABNORMAL LOW (ref 4.40–5.90)
RDW: 13.4 % (ref 11.5–14.5)
WBC: 3.2 10*3/uL — ABNORMAL LOW (ref 3.8–10.6)

## 2016-09-02 LAB — BASIC METABOLIC PANEL
Anion gap: 6 (ref 5–15)
BUN: 23 mg/dL — AB (ref 6–20)
CHLORIDE: 102 mmol/L (ref 101–111)
CO2: 28 mmol/L (ref 22–32)
CREATININE: 2.44 mg/dL — AB (ref 0.61–1.24)
Calcium: 9 mg/dL (ref 8.9–10.3)
GFR calc Af Amer: 29 mL/min — ABNORMAL LOW (ref >60)
GFR calc non Af Amer: 25 mL/min — ABNORMAL LOW (ref >60)
GLUCOSE: 65 mg/dL (ref 65–99)
Potassium: 4 mmol/L (ref 3.5–5.1)
Sodium: 136 mmol/L (ref 135–145)

## 2016-10-01 DIAGNOSIS — I4891 Unspecified atrial fibrillation: Secondary | ICD-10-CM | POA: Diagnosis not present

## 2016-10-01 DIAGNOSIS — N189 Chronic kidney disease, unspecified: Secondary | ICD-10-CM | POA: Diagnosis not present

## 2016-10-01 DIAGNOSIS — I5042 Chronic combined systolic (congestive) and diastolic (congestive) heart failure: Secondary | ICD-10-CM | POA: Diagnosis not present

## 2016-10-01 DIAGNOSIS — K219 Gastro-esophageal reflux disease without esophagitis: Secondary | ICD-10-CM | POA: Diagnosis not present

## 2016-10-01 DIAGNOSIS — Z79899 Other long term (current) drug therapy: Secondary | ICD-10-CM | POA: Diagnosis not present

## 2016-10-01 DIAGNOSIS — D649 Anemia, unspecified: Secondary | ICD-10-CM | POA: Diagnosis not present

## 2016-10-01 DIAGNOSIS — E032 Hypothyroidism due to medicaments and other exogenous substances: Secondary | ICD-10-CM | POA: Diagnosis not present

## 2016-10-01 DIAGNOSIS — E039 Hypothyroidism, unspecified: Secondary | ICD-10-CM | POA: Diagnosis not present

## 2016-10-01 DIAGNOSIS — Z87891 Personal history of nicotine dependence: Secondary | ICD-10-CM | POA: Diagnosis not present

## 2016-10-01 DIAGNOSIS — I130 Hypertensive heart and chronic kidney disease with heart failure and stage 1 through stage 4 chronic kidney disease, or unspecified chronic kidney disease: Secondary | ICD-10-CM | POA: Diagnosis not present

## 2016-10-01 DIAGNOSIS — Z86010 Personal history of colonic polyps: Secondary | ICD-10-CM | POA: Diagnosis not present

## 2016-11-05 ENCOUNTER — Other Ambulatory Visit
Admission: RE | Admit: 2016-11-05 | Discharge: 2016-11-05 | Disposition: A | Discharge status: Home / Self Care | Payer: Medicare Other | Source: Ambulatory Visit | Attending: Cardiology | Admitting: Cardiology

## 2016-11-05 DIAGNOSIS — Z95811 Presence of heart assist device: Secondary | ICD-10-CM | POA: Insufficient documentation

## 2016-11-05 DIAGNOSIS — I509 Heart failure, unspecified: Secondary | ICD-10-CM | POA: Insufficient documentation

## 2016-11-05 LAB — CBC
HCT: 33 % — ABNORMAL LOW (ref 40.0–52.0)
HEMOGLOBIN: 10.3 g/dL — AB (ref 13.0–18.0)
MCH: 23.7 pg — ABNORMAL LOW (ref 26.0–34.0)
MCHC: 31.2 g/dL — AB (ref 32.0–36.0)
MCV: 76 fL — AB (ref 80.0–100.0)
Platelets: 154 10*3/uL (ref 150–440)
RBC: 4.35 MIL/uL — AB (ref 4.40–5.90)
RDW: 14.2 % (ref 11.5–14.5)
WBC: 3.2 10*3/uL — ABNORMAL LOW (ref 3.8–10.6)

## 2016-11-05 LAB — BASIC METABOLIC PANEL
Anion gap: 7 (ref 5–15)
BUN: 24 mg/dL — ABNORMAL HIGH (ref 6–20)
CHLORIDE: 103 mmol/L (ref 101–111)
CO2: 27 mmol/L (ref 22–32)
CREATININE: 2.42 mg/dL — AB (ref 0.61–1.24)
Calcium: 9.1 mg/dL (ref 8.9–10.3)
GFR calc Af Amer: 29 mL/min — ABNORMAL LOW (ref >60)
GFR calc non Af Amer: 25 mL/min — ABNORMAL LOW (ref >60)
GLUCOSE: 100 mg/dL — AB (ref 65–99)
POTASSIUM: 4.5 mmol/L (ref 3.5–5.1)
SODIUM: 137 mmol/L (ref 135–145)

## 2016-11-05 LAB — TSH: TSH: 2.196 u[IU]/mL (ref 0.350–4.500)

## 2016-11-05 LAB — DIGOXIN LEVEL: Digoxin Level: 0.6 ng/mL — ABNORMAL LOW (ref 0.8–2.0)

## 2016-11-06 DIAGNOSIS — Z45018 Encounter for adjustment and management of other part of cardiac pacemaker: Secondary | ICD-10-CM | POA: Diagnosis not present

## 2016-11-06 DIAGNOSIS — Z4502 Encounter for adjustment and management of automatic implantable cardiac defibrillator: Secondary | ICD-10-CM | POA: Diagnosis not present

## 2016-12-03 DIAGNOSIS — I5022 Chronic systolic (congestive) heart failure: Secondary | ICD-10-CM | POA: Diagnosis not present

## 2017-01-10 DIAGNOSIS — Z95811 Presence of heart assist device: Secondary | ICD-10-CM | POA: Diagnosis not present

## 2017-01-10 DIAGNOSIS — E039 Hypothyroidism, unspecified: Secondary | ICD-10-CM | POA: Diagnosis not present

## 2017-01-10 DIAGNOSIS — K5900 Constipation, unspecified: Secondary | ICD-10-CM | POA: Diagnosis not present

## 2017-01-10 DIAGNOSIS — I5022 Chronic systolic (congestive) heart failure: Secondary | ICD-10-CM | POA: Diagnosis not present

## 2017-01-10 DIAGNOSIS — D508 Other iron deficiency anemias: Secondary | ICD-10-CM | POA: Diagnosis not present

## 2017-01-28 ENCOUNTER — Other Ambulatory Visit
Admission: RE | Admit: 2017-01-28 | Discharge: 2017-01-28 | Disposition: A | Discharge status: Home / Self Care | Payer: Medicare Other | Source: Ambulatory Visit | Attending: Cardiology | Admitting: Cardiology

## 2017-01-28 DIAGNOSIS — Z95811 Presence of heart assist device: Secondary | ICD-10-CM | POA: Insufficient documentation

## 2017-01-28 DIAGNOSIS — I509 Heart failure, unspecified: Secondary | ICD-10-CM | POA: Diagnosis not present

## 2017-01-28 LAB — TSH: TSH: 0.845 u[IU]/mL (ref 0.350–4.500)

## 2017-01-28 LAB — CBC
HCT: 31.4 % — ABNORMAL LOW (ref 40.0–52.0)
Hemoglobin: 9.7 g/dL — ABNORMAL LOW (ref 13.0–18.0)
MCH: 23.7 pg — AB (ref 26.0–34.0)
MCHC: 31 g/dL — AB (ref 32.0–36.0)
MCV: 76.6 fL — ABNORMAL LOW (ref 80.0–100.0)
PLATELETS: 164 10*3/uL (ref 150–440)
RBC: 4.1 MIL/uL — ABNORMAL LOW (ref 4.40–5.90)
RDW: 14.4 % (ref 11.5–14.5)
WBC: 3.6 10*3/uL — ABNORMAL LOW (ref 3.8–10.6)

## 2017-01-28 LAB — BASIC METABOLIC PANEL
Anion gap: 5 (ref 5–15)
BUN: 29 mg/dL — AB (ref 6–20)
CALCIUM: 9.2 mg/dL (ref 8.9–10.3)
CO2: 28 mmol/L (ref 22–32)
CREATININE: 2.47 mg/dL — AB (ref 0.61–1.24)
Chloride: 102 mmol/L (ref 101–111)
GFR, EST AFRICAN AMERICAN: 28 mL/min — AB (ref >60)
GFR, EST NON AFRICAN AMERICAN: 24 mL/min — AB (ref >60)
Glucose, Bld: 102 mg/dL — ABNORMAL HIGH (ref 65–99)
Potassium: 4.4 mmol/L (ref 3.5–5.1)
Sodium: 135 mmol/L (ref 135–145)

## 2017-01-28 LAB — DIGOXIN LEVEL: DIGOXIN LVL: 0.4 ng/mL — AB (ref 0.8–2.0)

## 2017-02-05 DIAGNOSIS — Z95810 Presence of automatic (implantable) cardiac defibrillator: Secondary | ICD-10-CM | POA: Diagnosis not present

## 2017-02-05 DIAGNOSIS — Z4502 Encounter for adjustment and management of automatic implantable cardiac defibrillator: Secondary | ICD-10-CM | POA: Diagnosis not present

## 2017-02-25 ENCOUNTER — Other Ambulatory Visit
Admission: RE | Admit: 2017-02-25 | Discharge: 2017-02-25 | Disposition: A | Discharge status: Home / Self Care | Payer: Medicare Other | Source: Ambulatory Visit | Attending: Cardiology | Admitting: Cardiology

## 2017-02-25 DIAGNOSIS — I509 Heart failure, unspecified: Secondary | ICD-10-CM | POA: Insufficient documentation

## 2017-02-25 DIAGNOSIS — Z95811 Presence of heart assist device: Secondary | ICD-10-CM | POA: Insufficient documentation

## 2017-02-25 LAB — BASIC METABOLIC PANEL
ANION GAP: 5 (ref 5–15)
BUN: 30 mg/dL — ABNORMAL HIGH (ref 6–20)
CALCIUM: 9.1 mg/dL (ref 8.9–10.3)
CHLORIDE: 103 mmol/L (ref 101–111)
CO2: 27 mmol/L (ref 22–32)
CREATININE: 2.79 mg/dL — AB (ref 0.61–1.24)
GFR calc non Af Amer: 21 mL/min — ABNORMAL LOW (ref >60)
GFR, EST AFRICAN AMERICAN: 25 mL/min — AB (ref >60)
Glucose, Bld: 102 mg/dL — ABNORMAL HIGH (ref 65–99)
Potassium: 4.4 mmol/L (ref 3.5–5.1)
SODIUM: 135 mmol/L (ref 135–145)

## 2017-02-25 LAB — CBC
HEMATOCRIT: 30.9 % — AB (ref 40.0–52.0)
HEMOGLOBIN: 9.6 g/dL — AB (ref 13.0–18.0)
MCH: 23.9 pg — ABNORMAL LOW (ref 26.0–34.0)
MCHC: 31 g/dL — AB (ref 32.0–36.0)
MCV: 77.1 fL — ABNORMAL LOW (ref 80.0–100.0)
Platelets: 170 10*3/uL (ref 150–440)
RBC: 4.01 MIL/uL — ABNORMAL LOW (ref 4.40–5.90)
RDW: 14.4 % (ref 11.5–14.5)
WBC: 3.5 10*3/uL — AB (ref 3.8–10.6)

## 2017-05-07 DIAGNOSIS — Z95810 Presence of automatic (implantable) cardiac defibrillator: Secondary | ICD-10-CM | POA: Diagnosis not present

## 2017-05-07 DIAGNOSIS — I509 Heart failure, unspecified: Secondary | ICD-10-CM | POA: Diagnosis not present

## 2017-05-07 DIAGNOSIS — Z4502 Encounter for adjustment and management of automatic implantable cardiac defibrillator: Secondary | ICD-10-CM | POA: Diagnosis not present

## 2017-05-16 DIAGNOSIS — Z7901 Long term (current) use of anticoagulants: Secondary | ICD-10-CM | POA: Diagnosis not present

## 2017-05-16 DIAGNOSIS — K922 Gastrointestinal hemorrhage, unspecified: Secondary | ICD-10-CM | POA: Diagnosis not present

## 2017-05-16 DIAGNOSIS — Z95811 Presence of heart assist device: Secondary | ICD-10-CM | POA: Diagnosis not present

## 2017-05-16 DIAGNOSIS — I4891 Unspecified atrial fibrillation: Secondary | ICD-10-CM | POA: Diagnosis not present

## 2017-05-16 DIAGNOSIS — Z79899 Other long term (current) drug therapy: Secondary | ICD-10-CM | POA: Diagnosis not present

## 2017-05-16 DIAGNOSIS — Z95810 Presence of automatic (implantable) cardiac defibrillator: Secondary | ICD-10-CM | POA: Diagnosis not present

## 2017-05-16 DIAGNOSIS — N183 Chronic kidney disease, stage 3 (moderate): Secondary | ICD-10-CM | POA: Diagnosis not present

## 2017-05-16 DIAGNOSIS — I130 Hypertensive heart and chronic kidney disease with heart failure and stage 1 through stage 4 chronic kidney disease, or unspecified chronic kidney disease: Secondary | ICD-10-CM | POA: Diagnosis not present

## 2017-05-16 DIAGNOSIS — I5084 End stage heart failure: Secondary | ICD-10-CM | POA: Diagnosis not present

## 2017-05-16 DIAGNOSIS — Z86010 Personal history of colonic polyps: Secondary | ICD-10-CM | POA: Diagnosis not present

## 2017-05-16 DIAGNOSIS — E039 Hypothyroidism, unspecified: Secondary | ICD-10-CM | POA: Diagnosis not present

## 2017-05-16 DIAGNOSIS — E785 Hyperlipidemia, unspecified: Secondary | ICD-10-CM | POA: Diagnosis not present

## 2017-05-16 DIAGNOSIS — D500 Iron deficiency anemia secondary to blood loss (chronic): Secondary | ICD-10-CM | POA: Diagnosis not present

## 2017-05-16 DIAGNOSIS — I429 Cardiomyopathy, unspecified: Secondary | ICD-10-CM | POA: Diagnosis not present

## 2017-05-16 DIAGNOSIS — Z87891 Personal history of nicotine dependence: Secondary | ICD-10-CM | POA: Diagnosis not present

## 2017-05-16 DIAGNOSIS — K219 Gastro-esophageal reflux disease without esophagitis: Secondary | ICD-10-CM | POA: Diagnosis not present

## 2017-05-16 DIAGNOSIS — N184 Chronic kidney disease, stage 4 (severe): Secondary | ICD-10-CM | POA: Diagnosis not present

## 2017-05-16 DIAGNOSIS — I5022 Chronic systolic (congestive) heart failure: Secondary | ICD-10-CM | POA: Diagnosis not present

## 2017-05-16 DIAGNOSIS — I480 Paroxysmal atrial fibrillation: Secondary | ICD-10-CM | POA: Diagnosis not present

## 2017-06-05 ENCOUNTER — Other Ambulatory Visit
Admission: RE | Admit: 2017-06-05 | Discharge: 2017-06-05 | Disposition: A | Discharge status: Home / Self Care | Payer: Medicare Other | Source: Ambulatory Visit | Attending: Cardiology | Admitting: Cardiology

## 2017-06-05 DIAGNOSIS — Z95811 Presence of heart assist device: Secondary | ICD-10-CM | POA: Insufficient documentation

## 2017-06-05 DIAGNOSIS — I509 Heart failure, unspecified: Secondary | ICD-10-CM | POA: Diagnosis not present

## 2017-06-05 LAB — BASIC METABOLIC PANEL WITH GFR
Anion gap: 4 — ABNORMAL LOW (ref 5–15)
BUN: 25 mg/dL — ABNORMAL HIGH (ref 6–20)
CO2: 29 mmol/L (ref 22–32)
Calcium: 9.2 mg/dL (ref 8.9–10.3)
Chloride: 103 mmol/L (ref 101–111)
Creatinine, Ser: 2.5 mg/dL — ABNORMAL HIGH (ref 0.61–1.24)
GFR calc Af Amer: 28 mL/min — ABNORMAL LOW
GFR calc non Af Amer: 24 mL/min — ABNORMAL LOW
Glucose, Bld: 103 mg/dL — ABNORMAL HIGH (ref 65–99)
Potassium: 4.3 mmol/L (ref 3.5–5.1)
Sodium: 136 mmol/L (ref 135–145)

## 2017-06-05 LAB — CBC
HCT: 31.9 % — ABNORMAL LOW (ref 40.0–52.0)
Hemoglobin: 10.1 g/dL — ABNORMAL LOW (ref 13.0–18.0)
MCH: 24 pg — ABNORMAL LOW (ref 26.0–34.0)
MCHC: 31.6 g/dL — ABNORMAL LOW (ref 32.0–36.0)
MCV: 76 fL — ABNORMAL LOW (ref 80.0–100.0)
Platelets: 168 K/uL (ref 150–440)
RBC: 4.2 MIL/uL — ABNORMAL LOW (ref 4.40–5.90)
RDW: 14.3 % (ref 11.5–14.5)
WBC: 4.3 K/uL (ref 3.8–10.6)

## 2017-06-30 ENCOUNTER — Other Ambulatory Visit
Admission: RE | Admit: 2017-06-30 | Discharge: 2017-06-30 | Disposition: A | Discharge status: Home / Self Care | Payer: Medicare Other | Source: Ambulatory Visit | Attending: Cardiology | Admitting: Cardiology

## 2017-06-30 DIAGNOSIS — I509 Heart failure, unspecified: Secondary | ICD-10-CM | POA: Insufficient documentation

## 2017-06-30 DIAGNOSIS — Z95811 Presence of heart assist device: Secondary | ICD-10-CM | POA: Insufficient documentation

## 2017-06-30 LAB — CBC
HEMATOCRIT: 32.4 % — AB (ref 40.0–52.0)
HEMOGLOBIN: 10.1 g/dL — AB (ref 13.0–18.0)
MCH: 23.8 pg — AB (ref 26.0–34.0)
MCHC: 31.2 g/dL — AB (ref 32.0–36.0)
MCV: 76.2 fL — ABNORMAL LOW (ref 80.0–100.0)
Platelets: 168 10*3/uL (ref 150–440)
RBC: 4.25 MIL/uL — ABNORMAL LOW (ref 4.40–5.90)
RDW: 14.1 % (ref 11.5–14.5)
WBC: 3.4 10*3/uL — ABNORMAL LOW (ref 3.8–10.6)

## 2017-06-30 LAB — BASIC METABOLIC PANEL
Anion gap: 6 (ref 5–15)
BUN: 28 mg/dL — ABNORMAL HIGH (ref 6–20)
CHLORIDE: 102 mmol/L (ref 101–111)
CO2: 28 mmol/L (ref 22–32)
Calcium: 9 mg/dL (ref 8.9–10.3)
Creatinine, Ser: 2.63 mg/dL — ABNORMAL HIGH (ref 0.61–1.24)
GFR, EST AFRICAN AMERICAN: 26 mL/min — AB (ref >60)
GFR, EST NON AFRICAN AMERICAN: 23 mL/min — AB (ref >60)
Glucose, Bld: 120 mg/dL — ABNORMAL HIGH (ref 65–99)
POTASSIUM: 4.5 mmol/L (ref 3.5–5.1)
SODIUM: 136 mmol/L (ref 135–145)

## 2017-06-30 LAB — DIGOXIN LEVEL: Digoxin Level: 0.3 ng/mL — ABNORMAL LOW (ref 0.8–2.0)

## 2017-06-30 LAB — TSH: TSH: 2.161 u[IU]/mL (ref 0.350–4.500)

## 2017-07-02 DIAGNOSIS — Z48812 Encounter for surgical aftercare following surgery on the circulatory system: Secondary | ICD-10-CM | POA: Diagnosis not present

## 2017-07-02 DIAGNOSIS — Z95811 Presence of heart assist device: Secondary | ICD-10-CM | POA: Diagnosis not present

## 2017-07-02 DIAGNOSIS — I428 Other cardiomyopathies: Secondary | ICD-10-CM | POA: Diagnosis not present

## 2017-07-02 DIAGNOSIS — Z4801 Encounter for change or removal of surgical wound dressing: Secondary | ICD-10-CM | POA: Diagnosis not present

## 2017-08-06 DIAGNOSIS — Z4502 Encounter for adjustment and management of automatic implantable cardiac defibrillator: Secondary | ICD-10-CM | POA: Diagnosis not present

## 2017-08-06 DIAGNOSIS — Z45018 Encounter for adjustment and management of other part of cardiac pacemaker: Secondary | ICD-10-CM | POA: Diagnosis not present

## 2017-09-19 DIAGNOSIS — Z95811 Presence of heart assist device: Secondary | ICD-10-CM | POA: Diagnosis not present

## 2017-09-19 DIAGNOSIS — Z7901 Long term (current) use of anticoagulants: Secondary | ICD-10-CM | POA: Diagnosis not present

## 2017-09-19 DIAGNOSIS — D508 Other iron deficiency anemias: Secondary | ICD-10-CM | POA: Diagnosis not present

## 2017-09-19 DIAGNOSIS — E032 Hypothyroidism due to medicaments and other exogenous substances: Secondary | ICD-10-CM | POA: Diagnosis not present

## 2017-09-19 DIAGNOSIS — I499 Cardiac arrhythmia, unspecified: Secondary | ICD-10-CM | POA: Diagnosis not present

## 2017-09-19 DIAGNOSIS — I5020 Unspecified systolic (congestive) heart failure: Secondary | ICD-10-CM | POA: Diagnosis not present

## 2017-09-19 DIAGNOSIS — I5022 Chronic systolic (congestive) heart failure: Secondary | ICD-10-CM | POA: Diagnosis not present

## 2017-09-19 DIAGNOSIS — E785 Hyperlipidemia, unspecified: Secondary | ICD-10-CM | POA: Diagnosis not present

## 2017-09-19 DIAGNOSIS — Z8719 Personal history of other diseases of the digestive system: Secondary | ICD-10-CM | POA: Diagnosis not present

## 2017-09-19 DIAGNOSIS — N183 Chronic kidney disease, stage 3 (moderate): Secondary | ICD-10-CM | POA: Diagnosis not present

## 2017-10-03 DIAGNOSIS — Z95811 Presence of heart assist device: Secondary | ICD-10-CM | POA: Diagnosis not present

## 2017-10-03 DIAGNOSIS — K5521 Angiodysplasia of colon with hemorrhage: Secondary | ICD-10-CM | POA: Diagnosis not present

## 2017-10-03 DIAGNOSIS — D509 Iron deficiency anemia, unspecified: Secondary | ICD-10-CM | POA: Diagnosis not present

## 2017-10-03 DIAGNOSIS — I5022 Chronic systolic (congestive) heart failure: Secondary | ICD-10-CM | POA: Diagnosis not present

## 2017-11-05 DIAGNOSIS — I4891 Unspecified atrial fibrillation: Secondary | ICD-10-CM | POA: Diagnosis not present

## 2017-11-05 DIAGNOSIS — Z4502 Encounter for adjustment and management of automatic implantable cardiac defibrillator: Secondary | ICD-10-CM | POA: Diagnosis not present

## 2017-11-05 DIAGNOSIS — I509 Heart failure, unspecified: Secondary | ICD-10-CM | POA: Diagnosis not present

## 2017-11-27 ENCOUNTER — Other Ambulatory Visit
Admission: RE | Admit: 2017-11-27 | Discharge: 2017-11-27 | Disposition: A | Discharge status: Home / Self Care | Payer: Medicare Other | Source: Ambulatory Visit | Attending: Cardiology | Admitting: Cardiology

## 2017-11-27 ENCOUNTER — Inpatient Hospital Stay: Admit: 2017-11-27 | Payer: Self-pay

## 2017-11-27 DIAGNOSIS — I509 Heart failure, unspecified: Secondary | ICD-10-CM | POA: Insufficient documentation

## 2017-11-27 DIAGNOSIS — Z95811 Presence of heart assist device: Secondary | ICD-10-CM | POA: Diagnosis not present

## 2017-11-27 LAB — BASIC METABOLIC PANEL
ANION GAP: 5 (ref 5–15)
BUN: 30 mg/dL — AB (ref 6–20)
CALCIUM: 8.5 mg/dL — AB (ref 8.9–10.3)
CO2: 26 mmol/L (ref 22–32)
Chloride: 105 mmol/L (ref 101–111)
Creatinine, Ser: 2.63 mg/dL — ABNORMAL HIGH (ref 0.61–1.24)
GFR calc Af Amer: 26 mL/min — ABNORMAL LOW (ref >60)
GFR, EST NON AFRICAN AMERICAN: 23 mL/min — AB (ref >60)
GLUCOSE: 74 mg/dL (ref 65–99)
Potassium: 4.2 mmol/L (ref 3.5–5.1)
Sodium: 136 mmol/L (ref 135–145)

## 2017-11-27 LAB — CBC
HCT: 26.6 % — ABNORMAL LOW (ref 40.0–52.0)
HEMOGLOBIN: 8.5 g/dL — AB (ref 13.0–18.0)
MCH: 24.9 pg — AB (ref 26.0–34.0)
MCHC: 31.9 g/dL — ABNORMAL LOW (ref 32.0–36.0)
MCV: 78.1 fL — ABNORMAL LOW (ref 80.0–100.0)
PLATELETS: 166 10*3/uL (ref 150–440)
RBC: 3.4 MIL/uL — AB (ref 4.40–5.90)
RDW: 13.8 % (ref 11.5–14.5)
WBC: 4 10*3/uL (ref 3.8–10.6)

## 2017-11-27 LAB — DIGOXIN LEVEL: DIGOXIN LVL: 0.6 ng/mL — AB (ref 0.8–2.0)

## 2017-11-27 LAB — TSH: TSH: 1.02 u[IU]/mL (ref 0.350–4.500)

## 2017-12-15 ENCOUNTER — Other Ambulatory Visit
Admission: RE | Admit: 2017-12-15 | Discharge: 2017-12-15 | Disposition: A | Discharge status: Home / Self Care | Payer: Medicare Other | Source: Ambulatory Visit | Attending: Cardiology | Admitting: Cardiology

## 2017-12-15 DIAGNOSIS — Z95811 Presence of heart assist device: Secondary | ICD-10-CM | POA: Insufficient documentation

## 2017-12-15 DIAGNOSIS — I509 Heart failure, unspecified: Secondary | ICD-10-CM | POA: Diagnosis not present

## 2017-12-15 LAB — TSH: TSH: 2.849 u[IU]/mL (ref 0.350–4.500)

## 2017-12-15 LAB — CBC
HEMATOCRIT: 21.9 % — AB (ref 40.0–52.0)
Hemoglobin: 7.1 g/dL — ABNORMAL LOW (ref 13.0–18.0)
MCH: 25 pg — AB (ref 26.0–34.0)
MCHC: 32.3 g/dL (ref 32.0–36.0)
MCV: 77.3 fL — AB (ref 80.0–100.0)
Platelets: 203 10*3/uL (ref 150–440)
RBC: 2.83 MIL/uL — ABNORMAL LOW (ref 4.40–5.90)
RDW: 13.9 % (ref 11.5–14.5)
WBC: 8.4 10*3/uL (ref 3.8–10.6)

## 2017-12-15 LAB — BASIC METABOLIC PANEL
Anion gap: 6 (ref 5–15)
BUN: 45 mg/dL — AB (ref 6–20)
CHLORIDE: 102 mmol/L (ref 101–111)
CO2: 24 mmol/L (ref 22–32)
Calcium: 8.7 mg/dL — ABNORMAL LOW (ref 8.9–10.3)
Creatinine, Ser: 3.07 mg/dL — ABNORMAL HIGH (ref 0.61–1.24)
GFR calc Af Amer: 22 mL/min — ABNORMAL LOW (ref >60)
GFR calc non Af Amer: 19 mL/min — ABNORMAL LOW (ref >60)
GLUCOSE: 130 mg/dL — AB (ref 65–99)
POTASSIUM: 4.6 mmol/L (ref 3.5–5.1)
Sodium: 132 mmol/L — ABNORMAL LOW (ref 135–145)

## 2017-12-15 LAB — DIGOXIN LEVEL: Digoxin Level: 1.3 ng/mL (ref 0.8–2.0)

## 2017-12-16 DIAGNOSIS — E785 Hyperlipidemia, unspecified: Secondary | ICD-10-CM | POA: Diagnosis not present

## 2017-12-16 DIAGNOSIS — D509 Iron deficiency anemia, unspecified: Secondary | ICD-10-CM | POA: Diagnosis not present

## 2017-12-16 DIAGNOSIS — K219 Gastro-esophageal reflux disease without esophagitis: Secondary | ICD-10-CM | POA: Diagnosis not present

## 2017-12-16 DIAGNOSIS — N183 Chronic kidney disease, stage 3 (moderate): Secondary | ICD-10-CM | POA: Diagnosis not present

## 2017-12-16 DIAGNOSIS — I5084 End stage heart failure: Secondary | ICD-10-CM | POA: Diagnosis not present

## 2017-12-16 DIAGNOSIS — K921 Melena: Secondary | ICD-10-CM | POA: Diagnosis not present

## 2017-12-16 DIAGNOSIS — R809 Proteinuria, unspecified: Secondary | ICD-10-CM | POA: Diagnosis not present

## 2017-12-16 DIAGNOSIS — Z886 Allergy status to analgesic agent status: Secondary | ICD-10-CM | POA: Diagnosis not present

## 2017-12-16 DIAGNOSIS — I130 Hypertensive heart and chronic kidney disease with heart failure and stage 1 through stage 4 chronic kidney disease, or unspecified chronic kidney disease: Secondary | ICD-10-CM | POA: Diagnosis not present

## 2017-12-16 DIAGNOSIS — E039 Hypothyroidism, unspecified: Secondary | ICD-10-CM | POA: Diagnosis not present

## 2017-12-16 DIAGNOSIS — K317 Polyp of stomach and duodenum: Secondary | ICD-10-CM | POA: Diagnosis not present

## 2017-12-16 DIAGNOSIS — I5042 Chronic combined systolic (congestive) and diastolic (congestive) heart failure: Secondary | ICD-10-CM | POA: Diagnosis not present

## 2017-12-16 DIAGNOSIS — Z888 Allergy status to other drugs, medicaments and biological substances status: Secondary | ICD-10-CM | POA: Diagnosis not present

## 2017-12-16 DIAGNOSIS — Z87891 Personal history of nicotine dependence: Secondary | ICD-10-CM | POA: Diagnosis not present

## 2017-12-16 DIAGNOSIS — Z950 Presence of cardiac pacemaker: Secondary | ICD-10-CM | POA: Diagnosis not present

## 2017-12-16 DIAGNOSIS — I429 Cardiomyopathy, unspecified: Secondary | ICD-10-CM | POA: Diagnosis not present

## 2017-12-16 DIAGNOSIS — I4891 Unspecified atrial fibrillation: Secondary | ICD-10-CM | POA: Diagnosis not present

## 2017-12-17 DIAGNOSIS — D509 Iron deficiency anemia, unspecified: Secondary | ICD-10-CM | POA: Diagnosis not present

## 2017-12-17 DIAGNOSIS — I429 Cardiomyopathy, unspecified: Secondary | ICD-10-CM | POA: Diagnosis not present

## 2017-12-17 DIAGNOSIS — Z886 Allergy status to analgesic agent status: Secondary | ICD-10-CM | POA: Diagnosis not present

## 2017-12-17 DIAGNOSIS — E785 Hyperlipidemia, unspecified: Secondary | ICD-10-CM | POA: Diagnosis not present

## 2017-12-17 DIAGNOSIS — I4891 Unspecified atrial fibrillation: Secondary | ICD-10-CM | POA: Diagnosis not present

## 2017-12-17 DIAGNOSIS — Z950 Presence of cardiac pacemaker: Secondary | ICD-10-CM | POA: Diagnosis not present

## 2017-12-17 DIAGNOSIS — R809 Proteinuria, unspecified: Secondary | ICD-10-CM | POA: Diagnosis not present

## 2017-12-17 DIAGNOSIS — I130 Hypertensive heart and chronic kidney disease with heart failure and stage 1 through stage 4 chronic kidney disease, or unspecified chronic kidney disease: Secondary | ICD-10-CM | POA: Diagnosis not present

## 2017-12-17 DIAGNOSIS — K921 Melena: Secondary | ICD-10-CM | POA: Diagnosis not present

## 2017-12-17 DIAGNOSIS — I5042 Chronic combined systolic (congestive) and diastolic (congestive) heart failure: Secondary | ICD-10-CM | POA: Diagnosis not present

## 2017-12-17 DIAGNOSIS — I5021 Acute systolic (congestive) heart failure: Secondary | ICD-10-CM | POA: Diagnosis not present

## 2017-12-17 DIAGNOSIS — K219 Gastro-esophageal reflux disease without esophagitis: Secondary | ICD-10-CM | POA: Diagnosis not present

## 2017-12-17 DIAGNOSIS — E039 Hypothyroidism, unspecified: Secondary | ICD-10-CM | POA: Diagnosis not present

## 2017-12-17 DIAGNOSIS — Z7901 Long term (current) use of anticoagulants: Secondary | ICD-10-CM | POA: Diagnosis not present

## 2017-12-17 DIAGNOSIS — N183 Chronic kidney disease, stage 3 (moderate): Secondary | ICD-10-CM | POA: Diagnosis not present

## 2017-12-17 DIAGNOSIS — K317 Polyp of stomach and duodenum: Secondary | ICD-10-CM | POA: Diagnosis not present

## 2017-12-17 DIAGNOSIS — Z888 Allergy status to other drugs, medicaments and biological substances status: Secondary | ICD-10-CM | POA: Diagnosis not present

## 2017-12-17 DIAGNOSIS — I5084 End stage heart failure: Secondary | ICD-10-CM | POA: Diagnosis not present

## 2017-12-17 DIAGNOSIS — Z87891 Personal history of nicotine dependence: Secondary | ICD-10-CM | POA: Diagnosis not present

## 2017-12-17 DIAGNOSIS — Z95811 Presence of heart assist device: Secondary | ICD-10-CM | POA: Diagnosis not present

## 2017-12-17 DIAGNOSIS — Z8679 Personal history of other diseases of the circulatory system: Secondary | ICD-10-CM | POA: Diagnosis not present

## 2017-12-18 ENCOUNTER — Other Ambulatory Visit
Admission: RE | Admit: 2017-12-18 | Discharge: 2017-12-18 | Disposition: A | Discharge status: Home / Self Care | Payer: Medicare Other | Source: Ambulatory Visit | Attending: Cardiology | Admitting: Cardiology

## 2017-12-18 DIAGNOSIS — Z95811 Presence of heart assist device: Secondary | ICD-10-CM | POA: Diagnosis not present

## 2017-12-18 DIAGNOSIS — I509 Heart failure, unspecified: Secondary | ICD-10-CM | POA: Diagnosis not present

## 2017-12-18 LAB — CBC
HCT: 26.9 % — ABNORMAL LOW (ref 40.0–52.0)
Hemoglobin: 8.6 g/dL — ABNORMAL LOW (ref 13.0–18.0)
MCH: 25.5 pg — AB (ref 26.0–34.0)
MCHC: 32.1 g/dL (ref 32.0–36.0)
MCV: 79.4 fL — ABNORMAL LOW (ref 80.0–100.0)
PLATELETS: 216 10*3/uL (ref 150–440)
RBC: 3.38 MIL/uL — ABNORMAL LOW (ref 4.40–5.90)
RDW: 15 % — ABNORMAL HIGH (ref 11.5–14.5)
WBC: 5.8 10*3/uL (ref 3.8–10.6)

## 2017-12-23 ENCOUNTER — Other Ambulatory Visit
Admission: RE | Admit: 2017-12-23 | Discharge: 2017-12-23 | Disposition: A | Discharge status: Home / Self Care | Payer: Medicare Other | Source: Ambulatory Visit | Attending: Cardiology | Admitting: Cardiology

## 2017-12-23 DIAGNOSIS — I509 Heart failure, unspecified: Secondary | ICD-10-CM | POA: Insufficient documentation

## 2017-12-23 DIAGNOSIS — Z95811 Presence of heart assist device: Secondary | ICD-10-CM | POA: Diagnosis not present

## 2017-12-23 LAB — CBC
HEMATOCRIT: 24.9 % — AB (ref 40.0–52.0)
HEMOGLOBIN: 7.9 g/dL — AB (ref 13.0–18.0)
MCH: 25.5 pg — ABNORMAL LOW (ref 26.0–34.0)
MCHC: 31.9 g/dL — AB (ref 32.0–36.0)
MCV: 79.8 fL — ABNORMAL LOW (ref 80.0–100.0)
Platelets: 204 10*3/uL (ref 150–440)
RBC: 3.12 MIL/uL — AB (ref 4.40–5.90)
RDW: 15.1 % — ABNORMAL HIGH (ref 11.5–14.5)
WBC: 3.9 10*3/uL (ref 3.8–10.6)

## 2018-01-09 ENCOUNTER — Other Ambulatory Visit
Admission: RE | Admit: 2018-01-09 | Discharge: 2018-01-09 | Disposition: A | Discharge status: Home / Self Care | Payer: Medicare Other | Source: Ambulatory Visit | Attending: Cardiology | Admitting: Cardiology

## 2018-01-09 DIAGNOSIS — Z95811 Presence of heart assist device: Secondary | ICD-10-CM | POA: Diagnosis not present

## 2018-01-09 DIAGNOSIS — I509 Heart failure, unspecified: Secondary | ICD-10-CM | POA: Diagnosis not present

## 2018-01-09 LAB — CBC
HCT: 27.4 % — ABNORMAL LOW (ref 40.0–52.0)
Hemoglobin: 8.7 g/dL — ABNORMAL LOW (ref 13.0–18.0)
MCH: 25.1 pg — ABNORMAL LOW (ref 26.0–34.0)
MCHC: 31.5 g/dL — ABNORMAL LOW (ref 32.0–36.0)
MCV: 79.5 fL — ABNORMAL LOW (ref 80.0–100.0)
PLATELETS: 195 10*3/uL (ref 150–440)
RBC: 3.45 MIL/uL — AB (ref 4.40–5.90)
RDW: 14.9 % — AB (ref 11.5–14.5)
WBC: 3.4 10*3/uL — AB (ref 3.8–10.6)

## 2018-01-21 DIAGNOSIS — I480 Paroxysmal atrial fibrillation: Secondary | ICD-10-CM | POA: Diagnosis not present

## 2018-01-21 DIAGNOSIS — Z95811 Presence of heart assist device: Secondary | ICD-10-CM | POA: Diagnosis not present

## 2018-01-21 DIAGNOSIS — D500 Iron deficiency anemia secondary to blood loss (chronic): Secondary | ICD-10-CM | POA: Diagnosis not present

## 2018-01-21 DIAGNOSIS — I129 Hypertensive chronic kidney disease with stage 1 through stage 4 chronic kidney disease, or unspecified chronic kidney disease: Secondary | ICD-10-CM | POA: Diagnosis not present

## 2018-01-21 DIAGNOSIS — I499 Cardiac arrhythmia, unspecified: Secondary | ICD-10-CM | POA: Diagnosis not present

## 2018-01-21 DIAGNOSIS — Z95810 Presence of automatic (implantable) cardiac defibrillator: Secondary | ICD-10-CM | POA: Diagnosis not present

## 2018-01-21 DIAGNOSIS — I5020 Unspecified systolic (congestive) heart failure: Secondary | ICD-10-CM | POA: Diagnosis not present

## 2018-01-21 DIAGNOSIS — Z79899 Other long term (current) drug therapy: Secondary | ICD-10-CM | POA: Diagnosis not present

## 2018-01-21 DIAGNOSIS — E785 Hyperlipidemia, unspecified: Secondary | ICD-10-CM | POA: Diagnosis not present

## 2018-01-21 DIAGNOSIS — N183 Chronic kidney disease, stage 3 (moderate): Secondary | ICD-10-CM | POA: Diagnosis not present

## 2018-01-21 DIAGNOSIS — K922 Gastrointestinal hemorrhage, unspecified: Secondary | ICD-10-CM | POA: Diagnosis not present

## 2018-01-21 DIAGNOSIS — E039 Hypothyroidism, unspecified: Secondary | ICD-10-CM | POA: Diagnosis not present

## 2018-01-21 DIAGNOSIS — Z7901 Long term (current) use of anticoagulants: Secondary | ICD-10-CM | POA: Diagnosis not present

## 2018-01-21 DIAGNOSIS — I5042 Chronic combined systolic (congestive) and diastolic (congestive) heart failure: Secondary | ICD-10-CM | POA: Diagnosis not present

## 2018-02-04 DIAGNOSIS — Z45018 Encounter for adjustment and management of other part of cardiac pacemaker: Secondary | ICD-10-CM | POA: Diagnosis not present

## 2018-02-04 DIAGNOSIS — I5042 Chronic combined systolic (congestive) and diastolic (congestive) heart failure: Secondary | ICD-10-CM | POA: Diagnosis not present

## 2018-02-04 DIAGNOSIS — Z4502 Encounter for adjustment and management of automatic implantable cardiac defibrillator: Secondary | ICD-10-CM | POA: Diagnosis not present

## 2018-02-12 ENCOUNTER — Other Ambulatory Visit
Admission: RE | Admit: 2018-02-12 | Discharge: 2018-02-12 | Disposition: A | Discharge status: Home / Self Care | Payer: Medicare Other | Source: Ambulatory Visit | Attending: Cardiology | Admitting: Cardiology

## 2018-02-12 DIAGNOSIS — Z95811 Presence of heart assist device: Secondary | ICD-10-CM | POA: Diagnosis not present

## 2018-02-12 DIAGNOSIS — I509 Heart failure, unspecified: Secondary | ICD-10-CM | POA: Diagnosis not present

## 2018-02-12 LAB — BASIC METABOLIC PANEL
ANION GAP: 7 (ref 5–15)
BUN: 35 mg/dL — ABNORMAL HIGH (ref 6–20)
CHLORIDE: 102 mmol/L (ref 101–111)
CO2: 27 mmol/L (ref 22–32)
Calcium: 9.2 mg/dL (ref 8.9–10.3)
Creatinine, Ser: 2.91 mg/dL — ABNORMAL HIGH (ref 0.61–1.24)
GFR calc Af Amer: 23 mL/min — ABNORMAL LOW (ref >60)
GFR, EST NON AFRICAN AMERICAN: 20 mL/min — AB (ref >60)
GLUCOSE: 100 mg/dL — AB (ref 65–99)
POTASSIUM: 5.1 mmol/L (ref 3.5–5.1)
Sodium: 136 mmol/L (ref 135–145)

## 2018-02-12 LAB — CBC
HEMATOCRIT: 31.6 % — AB (ref 40.0–52.0)
HEMOGLOBIN: 9.9 g/dL — AB (ref 13.0–18.0)
MCH: 24.3 pg — ABNORMAL LOW (ref 26.0–34.0)
MCHC: 31.2 g/dL — ABNORMAL LOW (ref 32.0–36.0)
MCV: 77.9 fL — AB (ref 80.0–100.0)
Platelets: 178 10*3/uL (ref 150–440)
RBC: 4.06 MIL/uL — ABNORMAL LOW (ref 4.40–5.90)
RDW: 13.6 % (ref 11.5–14.5)
WBC: 3.7 10*3/uL — AB (ref 3.8–10.6)

## 2018-02-24 ENCOUNTER — Other Ambulatory Visit
Admission: RE | Admit: 2018-02-24 | Discharge: 2018-02-24 | Disposition: A | Discharge status: Home / Self Care | Payer: Medicare Other | Source: Ambulatory Visit | Attending: Cardiology | Admitting: Cardiology

## 2018-02-24 DIAGNOSIS — I509 Heart failure, unspecified: Secondary | ICD-10-CM | POA: Diagnosis not present

## 2018-02-24 DIAGNOSIS — Z95811 Presence of heart assist device: Secondary | ICD-10-CM | POA: Insufficient documentation

## 2018-02-24 LAB — BASIC METABOLIC PANEL WITH GFR
Anion gap: 8 (ref 5–15)
BUN: 27 mg/dL — ABNORMAL HIGH (ref 6–20)
CO2: 27 mmol/L (ref 22–32)
Calcium: 9.2 mg/dL (ref 8.9–10.3)
Chloride: 102 mmol/L (ref 101–111)
Creatinine, Ser: 2.79 mg/dL — ABNORMAL HIGH (ref 0.61–1.24)
GFR calc Af Amer: 24 mL/min — ABNORMAL LOW
GFR calc non Af Amer: 21 mL/min — ABNORMAL LOW
Glucose, Bld: 100 mg/dL — ABNORMAL HIGH (ref 65–99)
Potassium: 4.1 mmol/L (ref 3.5–5.1)
Sodium: 137 mmol/L (ref 135–145)

## 2018-02-24 LAB — CBC
HCT: 31.1 % — ABNORMAL LOW (ref 40.0–52.0)
Hemoglobin: 10 g/dL — ABNORMAL LOW (ref 13.0–18.0)
MCH: 24.6 pg — ABNORMAL LOW (ref 26.0–34.0)
MCHC: 32 g/dL (ref 32.0–36.0)
MCV: 76.8 fL — ABNORMAL LOW (ref 80.0–100.0)
Platelets: 163 K/uL (ref 150–440)
RBC: 4.05 MIL/uL — ABNORMAL LOW (ref 4.40–5.90)
RDW: 13.6 % (ref 11.5–14.5)
WBC: 3.4 K/uL — ABNORMAL LOW (ref 3.8–10.6)

## 2018-02-24 LAB — TSH: TSH: 1.382 u[IU]/mL (ref 0.350–4.500)

## 2018-02-24 LAB — DIGOXIN LEVEL: Digoxin Level: 0.8 ng/mL (ref 0.8–2.0)

## 2018-04-13 DIAGNOSIS — Z95811 Presence of heart assist device: Secondary | ICD-10-CM | POA: Diagnosis not present

## 2018-04-13 DIAGNOSIS — Z48812 Encounter for surgical aftercare following surgery on the circulatory system: Secondary | ICD-10-CM | POA: Diagnosis not present

## 2018-04-13 DIAGNOSIS — Z4801 Encounter for change or removal of surgical wound dressing: Secondary | ICD-10-CM | POA: Diagnosis not present

## 2018-04-13 DIAGNOSIS — I428 Other cardiomyopathies: Secondary | ICD-10-CM | POA: Diagnosis not present

## 2018-04-28 ENCOUNTER — Other Ambulatory Visit
Admission: RE | Admit: 2018-04-28 | Discharge: 2018-04-28 | Disposition: A | Discharge status: Home / Self Care | Payer: Medicare Other | Source: Ambulatory Visit | Attending: Cardiology | Admitting: Cardiology

## 2018-04-28 DIAGNOSIS — Z95811 Presence of heart assist device: Secondary | ICD-10-CM | POA: Diagnosis not present

## 2018-04-28 DIAGNOSIS — I509 Heart failure, unspecified: Secondary | ICD-10-CM | POA: Insufficient documentation

## 2018-04-28 LAB — BASIC METABOLIC PANEL
Anion gap: 10 (ref 5–15)
BUN: 30 mg/dL — ABNORMAL HIGH (ref 8–23)
CHLORIDE: 103 mmol/L (ref 98–111)
CO2: 25 mmol/L (ref 22–32)
CREATININE: 2.68 mg/dL — AB (ref 0.61–1.24)
Calcium: 9.3 mg/dL (ref 8.9–10.3)
GFR calc non Af Amer: 22 mL/min — ABNORMAL LOW (ref >60)
GFR, EST AFRICAN AMERICAN: 26 mL/min — AB (ref >60)
Glucose, Bld: 99 mg/dL (ref 70–99)
Potassium: 4.4 mmol/L (ref 3.5–5.1)
SODIUM: 138 mmol/L (ref 135–145)

## 2018-04-28 LAB — CBC
HCT: 33.1 % — ABNORMAL LOW (ref 40.0–52.0)
HEMOGLOBIN: 10.4 g/dL — AB (ref 13.0–18.0)
MCH: 23.7 pg — ABNORMAL LOW (ref 26.0–34.0)
MCHC: 31.3 g/dL — AB (ref 32.0–36.0)
MCV: 75.9 fL — ABNORMAL LOW (ref 80.0–100.0)
Platelets: 176 10*3/uL (ref 150–440)
RBC: 4.36 MIL/uL — AB (ref 4.40–5.90)
RDW: 14 % (ref 11.5–14.5)
WBC: 3.5 10*3/uL — ABNORMAL LOW (ref 3.8–10.6)

## 2018-05-06 DIAGNOSIS — I5042 Chronic combined systolic (congestive) and diastolic (congestive) heart failure: Secondary | ICD-10-CM | POA: Diagnosis not present

## 2018-05-06 DIAGNOSIS — Z4502 Encounter for adjustment and management of automatic implantable cardiac defibrillator: Secondary | ICD-10-CM | POA: Diagnosis not present

## 2018-07-03 DIAGNOSIS — Z452 Encounter for adjustment and management of vascular access device: Secondary | ICD-10-CM | POA: Diagnosis not present

## 2018-07-03 DIAGNOSIS — Z95811 Presence of heart assist device: Secondary | ICD-10-CM | POA: Diagnosis not present

## 2018-07-03 DIAGNOSIS — I5022 Chronic systolic (congestive) heart failure: Secondary | ICD-10-CM | POA: Diagnosis not present

## 2018-07-31 DIAGNOSIS — Z95811 Presence of heart assist device: Secondary | ICD-10-CM | POA: Diagnosis not present

## 2018-07-31 DIAGNOSIS — I5022 Chronic systolic (congestive) heart failure: Secondary | ICD-10-CM | POA: Diagnosis not present

## 2018-07-31 DIAGNOSIS — Z87891 Personal history of nicotine dependence: Secondary | ICD-10-CM | POA: Diagnosis not present

## 2018-07-31 DIAGNOSIS — Z79899 Other long term (current) drug therapy: Secondary | ICD-10-CM | POA: Diagnosis not present

## 2018-07-31 DIAGNOSIS — E785 Hyperlipidemia, unspecified: Secondary | ICD-10-CM | POA: Diagnosis not present

## 2018-07-31 DIAGNOSIS — E039 Hypothyroidism, unspecified: Secondary | ICD-10-CM | POA: Diagnosis not present

## 2018-07-31 DIAGNOSIS — I130 Hypertensive heart and chronic kidney disease with heart failure and stage 1 through stage 4 chronic kidney disease, or unspecified chronic kidney disease: Secondary | ICD-10-CM | POA: Diagnosis not present

## 2018-07-31 DIAGNOSIS — Z8679 Personal history of other diseases of the circulatory system: Secondary | ICD-10-CM | POA: Diagnosis not present

## 2018-07-31 DIAGNOSIS — R7989 Other specified abnormal findings of blood chemistry: Secondary | ICD-10-CM | POA: Diagnosis not present

## 2018-07-31 DIAGNOSIS — D649 Anemia, unspecified: Secondary | ICD-10-CM | POA: Diagnosis not present

## 2018-07-31 DIAGNOSIS — N183 Chronic kidney disease, stage 3 (moderate): Secondary | ICD-10-CM | POA: Diagnosis not present

## 2018-07-31 DIAGNOSIS — I429 Cardiomyopathy, unspecified: Secondary | ICD-10-CM | POA: Diagnosis not present

## 2018-07-31 DIAGNOSIS — I5042 Chronic combined systolic (congestive) and diastolic (congestive) heart failure: Secondary | ICD-10-CM | POA: Diagnosis not present

## 2018-07-31 DIAGNOSIS — I5084 End stage heart failure: Secondary | ICD-10-CM | POA: Diagnosis not present

## 2018-08-05 DIAGNOSIS — I5042 Chronic combined systolic (congestive) and diastolic (congestive) heart failure: Secondary | ICD-10-CM | POA: Diagnosis not present

## 2018-08-05 DIAGNOSIS — Z4502 Encounter for adjustment and management of automatic implantable cardiac defibrillator: Secondary | ICD-10-CM | POA: Diagnosis not present

## 2018-08-21 ENCOUNTER — Other Ambulatory Visit
Admission: RE | Admit: 2018-08-21 | Discharge: 2018-08-21 | Disposition: A | Discharge status: Home / Self Care | Payer: Medicare Other | Source: Ambulatory Visit | Attending: Cardiology | Admitting: Cardiology

## 2018-08-21 DIAGNOSIS — I509 Heart failure, unspecified: Secondary | ICD-10-CM | POA: Diagnosis not present

## 2018-08-21 DIAGNOSIS — Z95811 Presence of heart assist device: Secondary | ICD-10-CM | POA: Insufficient documentation

## 2018-08-21 LAB — BASIC METABOLIC PANEL
ANION GAP: 9 (ref 5–15)
BUN: 33 mg/dL — ABNORMAL HIGH (ref 8–23)
CO2: 26 mmol/L (ref 22–32)
CREATININE: 2.71 mg/dL — AB (ref 0.61–1.24)
Calcium: 8.4 mg/dL — ABNORMAL LOW (ref 8.9–10.3)
Chloride: 99 mmol/L (ref 98–111)
GFR, EST AFRICAN AMERICAN: 25 mL/min — AB (ref >60)
GFR, EST NON AFRICAN AMERICAN: 22 mL/min — AB (ref >60)
Glucose, Bld: 95 mg/dL (ref 70–99)
Potassium: 4.4 mmol/L (ref 3.5–5.1)
SODIUM: 134 mmol/L — AB (ref 135–145)

## 2018-08-21 LAB — PROTIME-INR
INR: 1.09
PROTHROMBIN TIME: 14 s (ref 11.4–15.2)

## 2018-10-01 ENCOUNTER — Other Ambulatory Visit
Admission: RE | Admit: 2018-10-01 | Discharge: 2018-10-01 | Disposition: A | Discharge status: Home / Self Care | Payer: Medicare Other | Attending: Cardiovascular Disease | Admitting: Cardiovascular Disease

## 2018-10-01 DIAGNOSIS — I509 Heart failure, unspecified: Secondary | ICD-10-CM | POA: Diagnosis not present

## 2018-10-01 DIAGNOSIS — Z95811 Presence of heart assist device: Secondary | ICD-10-CM | POA: Insufficient documentation

## 2018-10-01 LAB — CBC
HEMATOCRIT: 31.9 % — AB (ref 39.0–52.0)
Hemoglobin: 9.6 g/dL — ABNORMAL LOW (ref 13.0–17.0)
MCH: 23.4 pg — AB (ref 26.0–34.0)
MCHC: 30.1 g/dL (ref 30.0–36.0)
MCV: 77.6 fL — ABNORMAL LOW (ref 80.0–100.0)
Platelets: 170 10*3/uL (ref 150–400)
RBC: 4.11 MIL/uL — AB (ref 4.22–5.81)
RDW: 13.7 % (ref 11.5–15.5)
WBC: 3.4 10*3/uL — AB (ref 4.0–10.5)
nRBC: 0 % (ref 0.0–0.2)

## 2018-10-01 LAB — BASIC METABOLIC PANEL
Anion gap: 6 (ref 5–15)
BUN: 27 mg/dL — ABNORMAL HIGH (ref 8–23)
CALCIUM: 8.6 mg/dL — AB (ref 8.9–10.3)
CO2: 27 mmol/L (ref 22–32)
CREATININE: 2.59 mg/dL — AB (ref 0.61–1.24)
Chloride: 103 mmol/L (ref 98–111)
GFR calc non Af Amer: 23 mL/min — ABNORMAL LOW (ref >60)
GFR, EST AFRICAN AMERICAN: 27 mL/min — AB (ref >60)
Glucose, Bld: 87 mg/dL (ref 70–99)
Potassium: 4.8 mmol/L (ref 3.5–5.1)
Sodium: 136 mmol/L (ref 135–145)

## 2018-11-04 DIAGNOSIS — Z4502 Encounter for adjustment and management of automatic implantable cardiac defibrillator: Secondary | ICD-10-CM | POA: Diagnosis not present

## 2018-11-04 DIAGNOSIS — I429 Cardiomyopathy, unspecified: Secondary | ICD-10-CM | POA: Diagnosis not present

## 2018-12-02 DIAGNOSIS — E039 Hypothyroidism, unspecified: Secondary | ICD-10-CM | POA: Diagnosis not present

## 2018-12-02 DIAGNOSIS — N189 Chronic kidney disease, unspecified: Secondary | ICD-10-CM | POA: Diagnosis not present

## 2018-12-02 DIAGNOSIS — I429 Cardiomyopathy, unspecified: Secondary | ICD-10-CM | POA: Diagnosis not present

## 2018-12-02 DIAGNOSIS — Z7901 Long term (current) use of anticoagulants: Secondary | ICD-10-CM | POA: Diagnosis not present

## 2018-12-02 DIAGNOSIS — I5020 Unspecified systolic (congestive) heart failure: Secondary | ICD-10-CM | POA: Diagnosis not present

## 2018-12-02 DIAGNOSIS — I480 Paroxysmal atrial fibrillation: Secondary | ICD-10-CM | POA: Diagnosis not present

## 2018-12-02 DIAGNOSIS — D649 Anemia, unspecified: Secondary | ICD-10-CM | POA: Diagnosis not present

## 2018-12-02 DIAGNOSIS — Z950 Presence of cardiac pacemaker: Secondary | ICD-10-CM | POA: Diagnosis not present

## 2018-12-02 DIAGNOSIS — I428 Other cardiomyopathies: Secondary | ICD-10-CM | POA: Diagnosis not present

## 2018-12-02 DIAGNOSIS — Z95811 Presence of heart assist device: Secondary | ICD-10-CM | POA: Diagnosis not present

## 2018-12-02 DIAGNOSIS — I5022 Chronic systolic (congestive) heart failure: Secondary | ICD-10-CM | POA: Diagnosis not present

## 2018-12-02 DIAGNOSIS — I130 Hypertensive heart and chronic kidney disease with heart failure and stage 1 through stage 4 chronic kidney disease, or unspecified chronic kidney disease: Secondary | ICD-10-CM | POA: Diagnosis not present

## 2018-12-02 DIAGNOSIS — Z87891 Personal history of nicotine dependence: Secondary | ICD-10-CM | POA: Diagnosis not present

## 2018-12-02 DIAGNOSIS — K219 Gastro-esophageal reflux disease without esophagitis: Secondary | ICD-10-CM | POA: Diagnosis not present

## 2018-12-02 DIAGNOSIS — Z79899 Other long term (current) drug therapy: Secondary | ICD-10-CM | POA: Diagnosis not present

## 2018-12-24 ENCOUNTER — Other Ambulatory Visit: Payer: Self-pay

## 2018-12-24 ENCOUNTER — Other Ambulatory Visit
Admission: RE | Admit: 2018-12-24 | Discharge: 2018-12-24 | Disposition: A | Discharge status: Home / Self Care | Payer: Medicare Other | Attending: Cardiovascular Disease | Admitting: Cardiovascular Disease

## 2018-12-24 DIAGNOSIS — I509 Heart failure, unspecified: Secondary | ICD-10-CM | POA: Diagnosis not present

## 2018-12-24 DIAGNOSIS — Z95811 Presence of heart assist device: Secondary | ICD-10-CM | POA: Diagnosis not present

## 2018-12-24 LAB — BASIC METABOLIC PANEL
Anion gap: 7 (ref 5–15)
BUN: 29 mg/dL — AB (ref 8–23)
CO2: 28 mmol/L (ref 22–32)
CREATININE: 2.47 mg/dL — AB (ref 0.61–1.24)
Calcium: 8.9 mg/dL (ref 8.9–10.3)
Chloride: 100 mmol/L (ref 98–111)
GFR calc Af Amer: 29 mL/min — ABNORMAL LOW (ref >60)
GFR, EST NON AFRICAN AMERICAN: 25 mL/min — AB (ref >60)
Glucose, Bld: 98 mg/dL (ref 70–99)
Potassium: 4.5 mmol/L (ref 3.5–5.1)
SODIUM: 135 mmol/L (ref 135–145)

## 2018-12-24 LAB — CBC
HCT: 33.8 % — ABNORMAL LOW (ref 39.0–52.0)
Hemoglobin: 10.3 g/dL — ABNORMAL LOW (ref 13.0–17.0)
MCH: 24 pg — AB (ref 26.0–34.0)
MCHC: 30.5 g/dL (ref 30.0–36.0)
MCV: 78.8 fL — ABNORMAL LOW (ref 80.0–100.0)
PLATELETS: 193 10*3/uL (ref 150–400)
RBC: 4.29 MIL/uL (ref 4.22–5.81)
RDW: 13.8 % (ref 11.5–15.5)
WBC: 5.3 10*3/uL (ref 4.0–10.5)
nRBC: 0 % (ref 0.0–0.2)

## 2018-12-24 LAB — TSH: TSH: 2.232 u[IU]/mL (ref 0.350–4.500)

## 2019-02-02 ENCOUNTER — Other Ambulatory Visit: Payer: Self-pay

## 2019-02-02 ENCOUNTER — Other Ambulatory Visit
Admission: RE | Admit: 2019-02-02 | Discharge: 2019-02-02 | Disposition: A | Discharge status: Home / Self Care | Payer: Medicare Other | Attending: Cardiovascular Disease | Admitting: Cardiovascular Disease

## 2019-02-02 DIAGNOSIS — Z95811 Presence of heart assist device: Secondary | ICD-10-CM | POA: Diagnosis not present

## 2019-02-02 DIAGNOSIS — I509 Heart failure, unspecified: Secondary | ICD-10-CM | POA: Insufficient documentation

## 2019-02-02 LAB — BASIC METABOLIC PANEL
Anion gap: 7 (ref 5–15)
BUN: 35 mg/dL — ABNORMAL HIGH (ref 8–23)
CO2: 25 mmol/L (ref 22–32)
Calcium: 8.5 mg/dL — ABNORMAL LOW (ref 8.9–10.3)
Chloride: 103 mmol/L (ref 98–111)
Creatinine, Ser: 2.61 mg/dL — ABNORMAL HIGH (ref 0.61–1.24)
GFR calc Af Amer: 27 mL/min — ABNORMAL LOW (ref >60)
GFR calc non Af Amer: 23 mL/min — ABNORMAL LOW (ref >60)
Glucose, Bld: 85 mg/dL (ref 70–99)
Potassium: 4.4 mmol/L (ref 3.5–5.1)
Sodium: 135 mmol/L (ref 135–145)

## 2019-02-02 LAB — CBC
HCT: 33.9 % — ABNORMAL LOW (ref 39.0–52.0)
Hemoglobin: 10.4 g/dL — ABNORMAL LOW (ref 13.0–17.0)
MCH: 24.4 pg — ABNORMAL LOW (ref 26.0–34.0)
MCHC: 30.7 g/dL (ref 30.0–36.0)
MCV: 79.4 fL — ABNORMAL LOW (ref 80.0–100.0)
Platelets: 177 10*3/uL (ref 150–400)
RBC: 4.27 MIL/uL (ref 4.22–5.81)
RDW: 14.1 % (ref 11.5–15.5)
WBC: 3.9 10*3/uL — ABNORMAL LOW (ref 4.0–10.5)
nRBC: 0 % (ref 0.0–0.2)

## 2019-02-03 DIAGNOSIS — Z4502 Encounter for adjustment and management of automatic implantable cardiac defibrillator: Secondary | ICD-10-CM | POA: Diagnosis not present

## 2019-02-03 DIAGNOSIS — I429 Cardiomyopathy, unspecified: Secondary | ICD-10-CM | POA: Diagnosis not present

## 2019-03-18 ENCOUNTER — Other Ambulatory Visit
Admission: RE | Admit: 2019-03-18 | Discharge: 2019-03-18 | Disposition: A | Discharge status: Home / Self Care | Payer: Medicare Other | Attending: Cardiovascular Disease | Admitting: Cardiovascular Disease

## 2019-03-18 DIAGNOSIS — Z95811 Presence of heart assist device: Secondary | ICD-10-CM | POA: Diagnosis not present

## 2019-03-18 DIAGNOSIS — I509 Heart failure, unspecified: Secondary | ICD-10-CM | POA: Insufficient documentation

## 2019-03-18 LAB — BASIC METABOLIC PANEL
Anion gap: 8 (ref 5–15)
BUN: 27 mg/dL — ABNORMAL HIGH (ref 8–23)
CO2: 27 mmol/L (ref 22–32)
Calcium: 8.7 mg/dL — ABNORMAL LOW (ref 8.9–10.3)
Chloride: 101 mmol/L (ref 98–111)
Creatinine, Ser: 2.47 mg/dL — ABNORMAL HIGH (ref 0.61–1.24)
GFR calc Af Amer: 29 mL/min — ABNORMAL LOW (ref >60)
GFR calc non Af Amer: 25 mL/min — ABNORMAL LOW (ref >60)
Glucose, Bld: 91 mg/dL (ref 70–99)
Potassium: 3.9 mmol/L (ref 3.5–5.1)
Sodium: 136 mmol/L (ref 135–145)

## 2019-03-18 LAB — CBC
HCT: 31.7 % — ABNORMAL LOW (ref 39.0–52.0)
Hemoglobin: 9.9 g/dL — ABNORMAL LOW (ref 13.0–17.0)
MCH: 24.5 pg — ABNORMAL LOW (ref 26.0–34.0)
MCHC: 31.2 g/dL (ref 30.0–36.0)
MCV: 78.5 fL — ABNORMAL LOW (ref 80.0–100.0)
Platelets: 166 10*3/uL (ref 150–400)
RBC: 4.04 MIL/uL — ABNORMAL LOW (ref 4.22–5.81)
RDW: 14.2 % (ref 11.5–15.5)
WBC: 4.1 10*3/uL (ref 4.0–10.5)
nRBC: 0 % (ref 0.0–0.2)

## 2019-03-18 LAB — AST: AST: 21 U/L (ref 15–41)

## 2019-03-18 LAB — TSH: TSH: 1.849 u[IU]/mL (ref 0.350–4.500)

## 2019-03-18 LAB — IRON AND TIBC
Iron: 110 ug/dL (ref 45–182)
Saturation Ratios: 39 % (ref 17.9–39.5)
TIBC: 283 ug/dL (ref 250–450)
UIBC: 173 ug/dL

## 2019-03-18 LAB — ALT: ALT: 12 U/L (ref 0–44)

## 2019-03-18 LAB — BILIRUBIN, TOTAL: Total Bilirubin: 1.1 mg/dL (ref 0.3–1.2)

## 2019-03-18 LAB — FERRITIN: Ferritin: 99 ng/mL (ref 24–336)

## 2019-04-08 ENCOUNTER — Other Ambulatory Visit
Admission: RE | Admit: 2019-04-08 | Discharge: 2019-04-08 | Disposition: A | Discharge status: Home / Self Care | Payer: Medicare Other | Attending: Cardiovascular Disease | Admitting: Cardiovascular Disease

## 2019-04-08 ENCOUNTER — Other Ambulatory Visit: Payer: Self-pay

## 2019-04-08 DIAGNOSIS — Z95811 Presence of heart assist device: Secondary | ICD-10-CM | POA: Insufficient documentation

## 2019-04-08 DIAGNOSIS — I509 Heart failure, unspecified: Secondary | ICD-10-CM | POA: Diagnosis not present

## 2019-04-08 LAB — BASIC METABOLIC PANEL
Anion gap: 5 (ref 5–15)
BUN: 31 mg/dL — ABNORMAL HIGH (ref 8–23)
CO2: 26 mmol/L (ref 22–32)
Calcium: 8.8 mg/dL — ABNORMAL LOW (ref 8.9–10.3)
Chloride: 105 mmol/L (ref 98–111)
Creatinine, Ser: 2.84 mg/dL — ABNORMAL HIGH (ref 0.61–1.24)
GFR calc Af Amer: 24 mL/min — ABNORMAL LOW (ref >60)
GFR calc non Af Amer: 21 mL/min — ABNORMAL LOW (ref >60)
Glucose, Bld: 83 mg/dL (ref 70–99)
Potassium: 4.1 mmol/L (ref 3.5–5.1)
Sodium: 136 mmol/L (ref 135–145)

## 2019-04-28 DIAGNOSIS — E032 Hypothyroidism due to medicaments and other exogenous substances: Secondary | ICD-10-CM | POA: Diagnosis not present

## 2019-04-28 DIAGNOSIS — I130 Hypertensive heart and chronic kidney disease with heart failure and stage 1 through stage 4 chronic kidney disease, or unspecified chronic kidney disease: Secondary | ICD-10-CM | POA: Diagnosis not present

## 2019-04-28 DIAGNOSIS — Z79899 Other long term (current) drug therapy: Secondary | ICD-10-CM | POA: Diagnosis not present

## 2019-04-28 DIAGNOSIS — N183 Chronic kidney disease, stage 3 (moderate): Secondary | ICD-10-CM | POA: Diagnosis not present

## 2019-04-28 DIAGNOSIS — D509 Iron deficiency anemia, unspecified: Secondary | ICD-10-CM | POA: Diagnosis not present

## 2019-04-28 DIAGNOSIS — I4891 Unspecified atrial fibrillation: Secondary | ICD-10-CM | POA: Diagnosis not present

## 2019-04-28 DIAGNOSIS — E785 Hyperlipidemia, unspecified: Secondary | ICD-10-CM | POA: Diagnosis not present

## 2019-04-28 DIAGNOSIS — I5022 Chronic systolic (congestive) heart failure: Secondary | ICD-10-CM | POA: Diagnosis not present

## 2019-04-28 DIAGNOSIS — D631 Anemia in chronic kidney disease: Secondary | ICD-10-CM | POA: Diagnosis not present

## 2019-04-28 DIAGNOSIS — Z45018 Encounter for adjustment and management of other part of cardiac pacemaker: Secondary | ICD-10-CM | POA: Diagnosis not present

## 2019-04-28 DIAGNOSIS — I5042 Chronic combined systolic (congestive) and diastolic (congestive) heart failure: Secondary | ICD-10-CM | POA: Diagnosis not present

## 2019-05-26 ENCOUNTER — Other Ambulatory Visit: Payer: Self-pay

## 2019-05-26 ENCOUNTER — Other Ambulatory Visit
Admission: RE | Admit: 2019-05-26 | Discharge: 2019-05-26 | Disposition: A | Discharge status: Home / Self Care | Payer: Medicare Other | Attending: Cardiovascular Disease | Admitting: Cardiovascular Disease

## 2019-05-26 DIAGNOSIS — I509 Heart failure, unspecified: Secondary | ICD-10-CM | POA: Diagnosis not present

## 2019-05-26 DIAGNOSIS — Z95811 Presence of heart assist device: Secondary | ICD-10-CM | POA: Diagnosis not present

## 2019-05-26 LAB — CBC
HCT: 33.8 % — ABNORMAL LOW (ref 39.0–52.0)
Hemoglobin: 10 g/dL — ABNORMAL LOW (ref 13.0–17.0)
MCH: 23.6 pg — ABNORMAL LOW (ref 26.0–34.0)
MCHC: 29.6 g/dL — ABNORMAL LOW (ref 30.0–36.0)
MCV: 79.7 fL — ABNORMAL LOW (ref 80.0–100.0)
Platelets: 170 10*3/uL (ref 150–400)
RBC: 4.24 MIL/uL (ref 4.22–5.81)
RDW: 13.7 % (ref 11.5–15.5)
WBC: 4.2 10*3/uL (ref 4.0–10.5)
nRBC: 0 % (ref 0.0–0.2)

## 2019-05-26 LAB — BASIC METABOLIC PANEL
Anion gap: 8 (ref 5–15)
BUN: 33 mg/dL — ABNORMAL HIGH (ref 8–23)
CO2: 25 mmol/L (ref 22–32)
Calcium: 9 mg/dL (ref 8.9–10.3)
Chloride: 100 mmol/L (ref 98–111)
Creatinine, Ser: 2.77 mg/dL — ABNORMAL HIGH (ref 0.61–1.24)
GFR calc Af Amer: 25 mL/min — ABNORMAL LOW (ref >60)
GFR calc non Af Amer: 22 mL/min — ABNORMAL LOW (ref >60)
Glucose, Bld: 118 mg/dL — ABNORMAL HIGH (ref 70–99)
Potassium: 4.2 mmol/L (ref 3.5–5.1)
Sodium: 133 mmol/L — ABNORMAL LOW (ref 135–145)

## 2019-06-02 DIAGNOSIS — I5042 Chronic combined systolic (congestive) and diastolic (congestive) heart failure: Secondary | ICD-10-CM | POA: Diagnosis not present

## 2019-06-02 DIAGNOSIS — Z4502 Encounter for adjustment and management of automatic implantable cardiac defibrillator: Secondary | ICD-10-CM | POA: Diagnosis not present

## 2019-06-23 ENCOUNTER — Other Ambulatory Visit
Admission: RE | Admit: 2019-06-23 | Discharge: 2019-06-23 | Disposition: A | Discharge status: Home / Self Care | Payer: Medicare Other | Attending: Cardiovascular Disease | Admitting: Cardiovascular Disease

## 2019-06-23 ENCOUNTER — Other Ambulatory Visit: Payer: Self-pay

## 2019-06-23 DIAGNOSIS — Z95811 Presence of heart assist device: Secondary | ICD-10-CM | POA: Insufficient documentation

## 2019-06-23 DIAGNOSIS — I509 Heart failure, unspecified: Secondary | ICD-10-CM | POA: Insufficient documentation

## 2019-06-23 LAB — CBC WITH DIFFERENTIAL/PLATELET
Abs Immature Granulocytes: 0.03 10*3/uL (ref 0.00–0.07)
Basophils Absolute: 0 10*3/uL (ref 0.0–0.1)
Basophils Relative: 1 %
Eosinophils Absolute: 0.2 10*3/uL (ref 0.0–0.5)
Eosinophils Relative: 4 %
HCT: 32.4 % — ABNORMAL LOW (ref 39.0–52.0)
Hemoglobin: 10 g/dL — ABNORMAL LOW (ref 13.0–17.0)
Immature Granulocytes: 1 %
Lymphocytes Relative: 24 %
Lymphs Abs: 1 10*3/uL (ref 0.7–4.0)
MCH: 23.9 pg — ABNORMAL LOW (ref 26.0–34.0)
MCHC: 30.9 g/dL (ref 30.0–36.0)
MCV: 77.3 fL — ABNORMAL LOW (ref 80.0–100.0)
Monocytes Absolute: 0.5 10*3/uL (ref 0.1–1.0)
Monocytes Relative: 12 %
Neutro Abs: 2.4 10*3/uL (ref 1.7–7.7)
Neutrophils Relative %: 58 %
Platelets: 166 10*3/uL (ref 150–400)
RBC: 4.19 MIL/uL — ABNORMAL LOW (ref 4.22–5.81)
RDW: 13.8 % (ref 11.5–15.5)
WBC: 4.1 10*3/uL (ref 4.0–10.5)
nRBC: 0 % (ref 0.0–0.2)

## 2019-06-23 LAB — BASIC METABOLIC PANEL
Anion gap: 7 (ref 5–15)
BUN: 32 mg/dL — ABNORMAL HIGH (ref 8–23)
CO2: 25 mmol/L (ref 22–32)
Calcium: 8.7 mg/dL — ABNORMAL LOW (ref 8.9–10.3)
Chloride: 105 mmol/L (ref 98–111)
Creatinine, Ser: 2.85 mg/dL — ABNORMAL HIGH (ref 0.61–1.24)
GFR calc Af Amer: 24 mL/min — ABNORMAL LOW (ref >60)
GFR calc non Af Amer: 21 mL/min — ABNORMAL LOW (ref >60)
Glucose, Bld: 100 mg/dL — ABNORMAL HIGH (ref 70–99)
Potassium: 4.3 mmol/L (ref 3.5–5.1)
Sodium: 137 mmol/L (ref 135–145)

## 2019-07-12 ENCOUNTER — Ambulatory Visit (INDEPENDENT_AMBULATORY_CARE_PROVIDER_SITE_OTHER): Payer: Medicare Other

## 2019-07-12 ENCOUNTER — Other Ambulatory Visit: Payer: Self-pay

## 2019-07-12 ENCOUNTER — Ambulatory Visit
Admission: EM | Admit: 2019-07-12 | Discharge: 2019-07-12 | Disposition: A | Discharge status: Home / Self Care | Payer: Medicare Other | Attending: Family Medicine | Admitting: Family Medicine

## 2019-07-12 DIAGNOSIS — M25571 Pain in right ankle and joints of right foot: Secondary | ICD-10-CM

## 2019-07-12 DIAGNOSIS — W1840XA Slipping, tripping and stumbling without falling, unspecified, initial encounter: Secondary | ICD-10-CM

## 2019-07-12 DIAGNOSIS — S82831A Other fracture of upper and lower end of right fibula, initial encounter for closed fracture: Secondary | ICD-10-CM | POA: Diagnosis not present

## 2019-07-12 NOTE — ED Provider Notes (Signed)
[facility] ____________________________________________  Time seen: Approximately 8:58 AM  I have reviewed the triage vital signs and the nursing notes.   HISTORY  Chief Complaint Ankle Injury  HPI Ryan Mcdonald is a 73 y.o. male presenting for evaluation of right ankle pain after injury that occurred yesterday.  Patient states while he was walking he accidentally rolled his right ankle and felt pain.  States this morning he is able to put more weight on his ankle and has more movement.  But also noticed some swelling.  Denies fall or other injuries.  Denies alleviating measures.  Pain worse with palpation and walking.  Denies other aggravating factors.  No history of same.  No recent cough, chest pain or shortness of breath or fevers.  Reports otherwise doing well.  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   History reviewed. No pertinent surgical history.   No current facility-administered medications for this encounter.   Current Outpatient Medications:  .  amiodarone (PACERONE) 200 MG tablet, Take 200 mg by mouth daily., Disp: , Rfl:  .  cholecalciferol (VITAMIN D) 400 units TABS tablet, Take 2,000 Units by mouth., Disp: , Rfl:  .  digoxin (LANOXIN) 0.125 MG tablet,  Take by mouth daily., Disp: , Rfl:  .  docusate sodium (COLACE) 100 MG capsule, Take 100 mg by mouth 2 (two) times daily., Disp: , Rfl:  .  ferrous sulfate 325 (65 FE) MG tablet, Take 325 mg by mouth 2 (two) times daily with a meal., Disp: , Rfl:  .  fexofenadine-pseudoephedrine (ALLEGRA-D ALLERGY & CONGESTION) 180-240 MG 24 hr tablet, Take 1 tablet by mouth daily., Disp: 30 tablet, Rfl: 0 .  levothyroxine (SYNTHROID, LEVOTHROID) 25 MCG tablet, Take 25 mcg by mouth daily before breakfast., Disp: , Rfl:  .  magnesium oxide (MAG-OX) 400 MG tablet, Take 400 mg by mouth daily., Disp: , Rfl:  .  pantoprazole (PROTONIX) 40 MG tablet, Take 40 mg by mouth daily., Disp: , Rfl:  .  sildenafil (VIAGRA) 50 MG tablet, Take 50 mg by mouth daily as needed for erectile dysfunction., Disp: , Rfl:   Allergies Patient has no known allergies.  No family history on file.  Social History Social History   Tobacco Use  . Smoking status: Never Smoker  . Smokeless tobacco: Never Used  Substance Use Topics  . Alcohol use: No  . Drug use: No    Review of Systems Constitutional: No fever ENT: No sore throat. Cardiovascular: Denies chest pain. Respiratory: Denies shortness of breath. Musculoskeletal: Right ankle pain. Skin: Negative for rash. Neurological: Negative for headaches, focal weakness or numbness.   ____________________________________________   PHYSICAL EXAM:  VITAL SIGNS: ED Triage Vitals [07/12/19 0849]  Enc Vitals Group     BP 106/84     Pulse Rate 74     Resp 16     Temp 98.4 F (  36.9 C)     Temp Source Oral     SpO2 100 %     Weight 170 lb (77.1 kg)     Height 6\' 2"  (1.88 m)     Head Circumference      Peak Flow      Pain Score 4     Pain Loc      Pain Edu?      Excl. in [place]?     Constitutional: Alert and oriented. Well appearing and in no acute distress. Eyes: Conjunctivae are normal. ENT      Head: Normocephalic and atraumatic. Cardiovascular:Good peripheral  circulation. Respiratory: Normal respiratory effort without tachypnea nor retractions. Musculoskeletal: Ambulatory with antalgic gait.  Bilateral distal sensation intact. Except: Right ankle mild to moderate localized edema, mild medial malleolus tenderness, moderate lateral malleolus tenderness palpation, limited ankle rotation, right lower extremity otherwise nontender. Neurologic:  Normal speech and language.  Skin:  Skin is warm, dry and intact. No rash noted. Psychiatric: Mood and affect are normal. Speech and behavior are normal. Patient exhibits appropriate insight and judgment   ___________________________________________   LABS (all labs ordered are listed, but only abnormal results are displayed)  Labs Reviewed - No data to display  RADIOLOGY  Dg Ankle Complete Right  Result Date: 07/12/2019 CLINICAL DATA:  73 year old male with a history of pain after a fall EXAM: RIGHT ANKLE - COMPLETE 3+ VIEW COMPARISON:  None. FINDINGS: Acute fracture of the distal fibula at the level of the ankle mortise. Ankle mortise appears congruent. Soft tissue swelling on the lateral ankle. Vascular calcifications IMPRESSION: Acute fracture of the distal fibula at the level of the ankle mortise. Associated soft tissue swelling. Electronically Signed   By: Corrie Mckusick D.O.   On: 07/12/2019 09:27   ____________________________________________   PROCEDURES Procedures   Right posterior and stirrup OCL splint applied by nursing.  Walker given.  INITIAL IMPRESSION / ASSESSMENT AND PLAN / ED COURSE  Pertinent labs & imaging results that were available during my care of the patient were reviewed by me and considered in my medical decision making (see chart for details).  Well-appearing patient presenting for right ankle pain after twisting.  Right ankle x-ray as above, reviewed by myself, acute fracture distal fibula with soft tissue swelling.  Posterior stirrup OCL splinting applied, walker given.   Directed to ice, elevate, walker use, no weightbearing.  Follow-up with orthopedic this week.  Supportive care.Discussed indication, risks and benefits of medications with patient.  Discussed follow up with Primary care physician this week as needed. Discussed follow up and return parameters including no resolution or any worsening concerns. Patient verbalized understanding and agreed to plan.   ____________________________________________   FINAL CLINICAL IMPRESSION(S) / ED DIAGNOSES  Final diagnoses:  Closed fracture of distal end of right fibula, unspecified fracture morphology, initial encounter     ED Discharge Orders    None       Note: This dictation was prepared with Dragon dictation along with smaller phrase technology. Any transcriptional errors that result from this process are unintentional.         Marylene Land, NP 07/12/19 1039

## 2019-07-12 NOTE — Discharge Instructions (Addendum)
Keep in splint. Ice. Elevate.  Use walker.  No weightbearing on right leg.  Over-the-counter Tylenol as needed.  Follow-up with orthopedic this week.  See above to call today to schedule.  Follow up with your primary care physician this week as needed. Return to Urgent care for new or worsening concerns.

## 2019-07-12 NOTE — ED Triage Notes (Signed)
Pt states he "rolled ankle" yesterday and now has pain with standing, walking and moving. Denies fall or other injuries. Pt reports pain with weight bearing. Pt alert and oriented X4, cooperative, RR even and unlabored, color WNL. Pt in NAD.

## 2019-07-15 DIAGNOSIS — S8264XA Nondisplaced fracture of lateral malleolus of right fibula, initial encounter for closed fracture: Secondary | ICD-10-CM | POA: Diagnosis not present

## 2019-07-22 DIAGNOSIS — S8264XA Nondisplaced fracture of lateral malleolus of right fibula, initial encounter for closed fracture: Secondary | ICD-10-CM | POA: Diagnosis not present

## 2019-08-19 DIAGNOSIS — S8264XD Nondisplaced fracture of lateral malleolus of right fibula, subsequent encounter for closed fracture with routine healing: Secondary | ICD-10-CM | POA: Diagnosis not present

## 2019-09-01 DIAGNOSIS — Z4502 Encounter for adjustment and management of automatic implantable cardiac defibrillator: Secondary | ICD-10-CM | POA: Diagnosis not present

## 2019-09-22 ENCOUNTER — Other Ambulatory Visit
Admission: RE | Admit: 2019-09-22 | Discharge: 2019-09-22 | Disposition: A | Discharge status: Home / Self Care | Payer: Commercial Managed Care - HMO | Attending: Cardiovascular Disease | Admitting: Cardiovascular Disease

## 2019-09-22 ENCOUNTER — Other Ambulatory Visit: Payer: Self-pay

## 2019-09-22 DIAGNOSIS — Z95811 Presence of heart assist device: Secondary | ICD-10-CM | POA: Diagnosis not present

## 2019-09-22 DIAGNOSIS — I509 Heart failure, unspecified: Secondary | ICD-10-CM | POA: Insufficient documentation

## 2019-09-22 LAB — CBC
HCT: 31.6 % — ABNORMAL LOW (ref 39.0–52.0)
Hemoglobin: 9.6 g/dL — ABNORMAL LOW (ref 13.0–17.0)
MCH: 23.4 pg — ABNORMAL LOW (ref 26.0–34.0)
MCHC: 30.4 g/dL (ref 30.0–36.0)
MCV: 77.1 fL — ABNORMAL LOW (ref 80.0–100.0)
Platelets: 191 10*3/uL (ref 150–400)
RBC: 4.1 MIL/uL — ABNORMAL LOW (ref 4.22–5.81)
RDW: 14.2 % (ref 11.5–15.5)
WBC: 4.3 10*3/uL (ref 4.0–10.5)
nRBC: 0 % (ref 0.0–0.2)

## 2019-09-22 LAB — BASIC METABOLIC PANEL
Anion gap: 7 (ref 5–15)
BUN: 28 mg/dL — ABNORMAL HIGH (ref 8–23)
CO2: 26 mmol/L (ref 22–32)
Calcium: 9 mg/dL (ref 8.9–10.3)
Chloride: 102 mmol/L (ref 98–111)
Creatinine, Ser: 2.66 mg/dL — ABNORMAL HIGH (ref 0.61–1.24)
GFR calc Af Amer: 26 mL/min — ABNORMAL LOW (ref >60)
GFR calc non Af Amer: 22 mL/min — ABNORMAL LOW (ref >60)
Glucose, Bld: 109 mg/dL — ABNORMAL HIGH (ref 70–99)
Potassium: 4.1 mmol/L (ref 3.5–5.1)
Sodium: 135 mmol/L (ref 135–145)

## 2019-09-24 DIAGNOSIS — Z48812 Encounter for surgical aftercare following surgery on the circulatory system: Secondary | ICD-10-CM | POA: Diagnosis not present

## 2019-09-24 DIAGNOSIS — Z4801 Encounter for change or removal of surgical wound dressing: Secondary | ICD-10-CM | POA: Diagnosis not present

## 2019-09-24 DIAGNOSIS — Z95811 Presence of heart assist device: Secondary | ICD-10-CM | POA: Diagnosis not present

## 2019-09-24 DIAGNOSIS — I428 Other cardiomyopathies: Secondary | ICD-10-CM | POA: Diagnosis not present

## 2019-11-05 DIAGNOSIS — I4891 Unspecified atrial fibrillation: Secondary | ICD-10-CM | POA: Diagnosis not present

## 2019-11-05 DIAGNOSIS — D508 Other iron deficiency anemias: Secondary | ICD-10-CM | POA: Diagnosis not present

## 2019-11-05 DIAGNOSIS — Z95811 Presence of heart assist device: Secondary | ICD-10-CM | POA: Diagnosis not present

## 2019-11-05 DIAGNOSIS — N184 Chronic kidney disease, stage 4 (severe): Secondary | ICD-10-CM | POA: Diagnosis not present

## 2019-11-05 DIAGNOSIS — I5042 Chronic combined systolic (congestive) and diastolic (congestive) heart failure: Secondary | ICD-10-CM | POA: Diagnosis not present

## 2019-11-05 DIAGNOSIS — I5022 Chronic systolic (congestive) heart failure: Secondary | ICD-10-CM | POA: Diagnosis not present

## 2019-12-01 DIAGNOSIS — Z4502 Encounter for adjustment and management of automatic implantable cardiac defibrillator: Secondary | ICD-10-CM | POA: Diagnosis not present

## 2019-12-20 ENCOUNTER — Other Ambulatory Visit: Payer: Self-pay

## 2019-12-20 ENCOUNTER — Ambulatory Visit
Admission: EM | Admit: 2019-12-20 | Discharge: 2019-12-20 | Disposition: A | Discharge status: Home / Self Care | Payer: Medicare Other | Attending: Family Medicine | Admitting: Family Medicine

## 2019-12-20 ENCOUNTER — Encounter: Payer: Self-pay | Admitting: Emergency Medicine

## 2019-12-20 DIAGNOSIS — S29012A Strain of muscle and tendon of back wall of thorax, initial encounter: Secondary | ICD-10-CM | POA: Diagnosis not present

## 2019-12-20 MED ORDER — PREDNISONE 20 MG PO TABS: 20.0000 mg | ORAL_TABLET | Freq: Every day | ORAL | 0 refills | Status: AC

## 2019-12-20 NOTE — ED Provider Notes (Signed)
[facility]    CSN: [id number] Arrival date & time: 12/20/19  1628      History   Chief Complaint Chief Complaint  Patient presents with  . Neck Pain    HPI Ryan Mcdonald is a 74 y.o. male.   74 yo male with a c/o upper back pain for 1 month. Denies any falls or other traumatic injury. Denies any fevers, chills, numbness/tingling. States pain radiates to the back of the neck.    Neck Pain   History reviewed. No pertinent past medical history.  There are no problems to display for this patient.   History reviewed. No pertinent surgical history.     Home Medications    Prior to Admission medications   Medication Sig Start Date End Date Taking? Authorizing Provider  amiodarone (PACERONE) 200 MG tablet Take 200 mg by mouth daily.   Yes [provider]  cholecalciferol (VITAMIN D) 400 units TABS tablet Take 2,000 Units by mouth.   Yes [provider]  digoxin (LANOXIN) 0.125 MG tablet Take by mouth daily.   Yes [provider]  ferrous sulfate 325 (65 FE) MG tablet Take 325 mg by mouth 2 (two) times daily with a meal.   Yes [provider]  fexofenadine-pseudoephedrine (ALLEGRA-D ALLERGY & CONGESTION) 180-240 MG 24 hr tablet Take 1 tablet by mouth daily. 06/11/16  Yes Frederich Cha, MD  levothyroxine (SYNTHROID, LEVOTHROID) 25 MCG tablet Take 25 mcg by mouth daily before breakfast.   Yes [provider]  magnesium oxide (MAG-OX) 400 MG tablet Take 400 mg by mouth daily.   Yes [provider]  pantoprazole (PROTONIX) 40 MG tablet Take 40 mg by mouth daily.   Yes [provider]  docusate sodium (COLACE) 100 MG capsule Take 100 mg by mouth 2 (two) times daily.    [provider]  predniSONE (DELTASONE) 20 MG tablet Take 1 tablet (20 mg total) by mouth daily. 12/20/19   Norval Gable, MD  sildenafil (VIAGRA) 50 MG tablet Take 50 mg by mouth daily as needed for erectile dysfunction.     [provider]    Family History History reviewed. No pertinent family history.  Social History Social History   Tobacco Use  . Smoking status: Never Smoker  . Smokeless tobacco: Never Used  Substance Use Topics  . Alcohol use: No  . Drug use: No     Allergies   Patient has no known allergies.   Review of Systems Review of Systems  Musculoskeletal: Positive for neck pain.     Physical Exam Triage Vital Signs ED Triage Vitals  Enc Vitals Group     BP 12/20/19 1642 125/88     Pulse Rate 12/20/19 1642 74     Resp 12/20/19 1642 18     Temp 12/20/19 1642 98.5 F (36.9 C)     Temp Source 12/20/19 1642 Oral     SpO2 12/20/19 1642 100 %     Weight 12/20/19 1641 165 lb (74.8 kg)     Height 12/20/19 1641 6\' 2"  (1.88 m)     Head Circumference --      Peak Flow --      Pain Score 12/20/19 1641 4     Pain Loc --      Pain Edu? --      Excl. in [place]? --    No data found.  Updated Vital Signs BP 125/88 (BP Location: Right Arm)   Pulse 74  Temp 98.5 F (36.9 C) (Oral)   Resp 18   Ht 6\' 2"  (1.88 m)   Wt 74.8 kg   SpO2 100%   BMI 21.18 kg/m   Visual Acuity Right Eye Distance:   Left Eye Distance:   Bilateral Distance:    Right Eye Near:   Left Eye Near:    Bilateral Near:     Physical Exam Vitals and nursing note reviewed.  Constitutional:      General: He is not in acute distress.    Appearance: He is not toxic-appearing or diaphoretic.  Musculoskeletal:     Thoracic back: Spasms (over the right trapezius muscle) and tenderness present. No swelling, edema, deformity, signs of trauma, lacerations or bony tenderness. Normal range of motion. No scoliosis.  Neurological:     Mental Status: He is alert.      UC Treatments / Results  Labs (all labs ordered are listed, but only abnormal results are displayed) Labs Reviewed - No data to display  EKG   Radiology No results found.  Procedures Procedures (including critical care  time)  Medications Ordered in UC Medications - No data to display  Initial Impression / Assessment and Plan / UC Course  I have reviewed the triage vital signs and the nursing notes.  Pertinent labs & imaging results that were available during my care of the patient were reviewed by me and considered in my medical decision making (see chart for details).      Final Clinical Impressions(s) / UC Diagnoses   Final diagnoses:  Upper back strain, initial encounter     Discharge Instructions     Heat, stretches, massage, tylenol    ED Prescriptions    Medication Sig Dispense Auth. Provider   predniSONE (DELTASONE) 20 MG tablet Take 1 tablet (20 mg total) by mouth daily. 5 tablet Norval Gable, MD     1. diagnosis reviewed with patient 2. rx as per orders above; reviewed possible side effects, interactions, risks and benefits  3. Recommend supportive treatment as above 4. Follow-up prn if symptoms worsen or don't improve  PDMP not reviewed this encounter.   Norval Gable, MD 12/20/19 [phone]

## 2019-12-20 NOTE — Discharge Instructions (Signed)
Heat, stretches, massage, tylenol

## 2019-12-20 NOTE — ED Triage Notes (Signed)
Patient c/o neck pain at night time that started about 1 month ago. Denies injury.

## 2019-12-30 ENCOUNTER — Other Ambulatory Visit: Payer: Self-pay

## 2019-12-30 ENCOUNTER — Other Ambulatory Visit
Admission: RE | Admit: 2019-12-30 | Discharge: 2019-12-30 | Disposition: A | Discharge status: Home / Self Care | Payer: Medicare Other | Attending: Cardiovascular Disease | Admitting: Cardiovascular Disease

## 2019-12-30 DIAGNOSIS — Z95811 Presence of heart assist device: Secondary | ICD-10-CM | POA: Insufficient documentation

## 2019-12-30 DIAGNOSIS — I509 Heart failure, unspecified: Secondary | ICD-10-CM | POA: Insufficient documentation

## 2019-12-30 LAB — CBC
HCT: 31.8 % — ABNORMAL LOW (ref 39.0–52.0)
Hemoglobin: 9.6 g/dL — ABNORMAL LOW (ref 13.0–17.0)
MCH: 23.2 pg — ABNORMAL LOW (ref 26.0–34.0)
MCHC: 30.2 g/dL (ref 30.0–36.0)
MCV: 77 fL — ABNORMAL LOW (ref 80.0–100.0)
Platelets: 177 10*3/uL (ref 150–400)
RBC: 4.13 MIL/uL — ABNORMAL LOW (ref 4.22–5.81)
RDW: 13.8 % (ref 11.5–15.5)
WBC: 4.5 10*3/uL (ref 4.0–10.5)
nRBC: 0 % (ref 0.0–0.2)

## 2019-12-30 LAB — BASIC METABOLIC PANEL
Anion gap: 10 (ref 5–15)
BUN: 32 mg/dL — ABNORMAL HIGH (ref 8–23)
CO2: 25 mmol/L (ref 22–32)
Calcium: 8.6 mg/dL — ABNORMAL LOW (ref 8.9–10.3)
Chloride: 99 mmol/L (ref 98–111)
Creatinine, Ser: 2.7 mg/dL — ABNORMAL HIGH (ref 0.61–1.24)
GFR calc Af Amer: 26 mL/min — ABNORMAL LOW (ref >60)
GFR calc non Af Amer: 22 mL/min — ABNORMAL LOW (ref >60)
Glucose, Bld: 92 mg/dL (ref 70–99)
Potassium: 3.8 mmol/L (ref 3.5–5.1)
Sodium: 134 mmol/L — ABNORMAL LOW (ref 135–145)

## 2020-01-14 DIAGNOSIS — M542 Cervicalgia: Secondary | ICD-10-CM | POA: Diagnosis not present

## 2020-01-27 ENCOUNTER — Other Ambulatory Visit: Payer: Self-pay

## 2020-01-27 ENCOUNTER — Other Ambulatory Visit
Admission: RE | Admit: 2020-01-27 | Discharge: 2020-01-27 | Disposition: A | Discharge status: Home / Self Care | Payer: Medicare Other | Attending: Cardiovascular Disease | Admitting: Cardiovascular Disease

## 2020-01-27 DIAGNOSIS — Z95811 Presence of heart assist device: Secondary | ICD-10-CM | POA: Insufficient documentation

## 2020-01-27 DIAGNOSIS — I509 Heart failure, unspecified: Secondary | ICD-10-CM | POA: Diagnosis not present

## 2020-01-27 LAB — CBC
HCT: 35.5 % — ABNORMAL LOW (ref 39.0–52.0)
Hemoglobin: 10.7 g/dL — ABNORMAL LOW (ref 13.0–17.0)
MCH: 23.4 pg — ABNORMAL LOW (ref 26.0–34.0)
MCHC: 30.1 g/dL (ref 30.0–36.0)
MCV: 77.5 fL — ABNORMAL LOW (ref 80.0–100.0)
Platelets: 185 10*3/uL (ref 150–400)
RBC: 4.58 MIL/uL (ref 4.22–5.81)
RDW: 13.9 % (ref 11.5–15.5)
WBC: 3.3 10*3/uL — ABNORMAL LOW (ref 4.0–10.5)
nRBC: 0 % (ref 0.0–0.2)

## 2020-01-27 LAB — BASIC METABOLIC PANEL
Anion gap: 7 (ref 5–15)
BUN: 37 mg/dL — ABNORMAL HIGH (ref 8–23)
CO2: 27 mmol/L (ref 22–32)
Calcium: 9 mg/dL (ref 8.9–10.3)
Chloride: 100 mmol/L (ref 98–111)
Creatinine, Ser: 3.11 mg/dL — ABNORMAL HIGH (ref 0.61–1.24)
GFR calc Af Amer: 22 mL/min — ABNORMAL LOW (ref >60)
GFR calc non Af Amer: 19 mL/min — ABNORMAL LOW (ref >60)
Glucose, Bld: 103 mg/dL — ABNORMAL HIGH (ref 70–99)
Potassium: 4.5 mmol/L (ref 3.5–5.1)
Sodium: 134 mmol/L — ABNORMAL LOW (ref 135–145)

## 2020-02-09 ENCOUNTER — Other Ambulatory Visit
Admission: RE | Admit: 2020-02-09 | Discharge: 2020-02-09 | Disposition: A | Discharge status: Home / Self Care | Payer: Medicare Other | Attending: Cardiovascular Disease | Admitting: Cardiovascular Disease

## 2020-02-09 ENCOUNTER — Other Ambulatory Visit: Payer: Self-pay

## 2020-02-09 DIAGNOSIS — Z95811 Presence of heart assist device: Secondary | ICD-10-CM | POA: Insufficient documentation

## 2020-02-09 DIAGNOSIS — I509 Heart failure, unspecified: Secondary | ICD-10-CM | POA: Diagnosis not present

## 2020-02-09 LAB — CBC
HCT: 30.8 % — ABNORMAL LOW (ref 39.0–52.0)
Hemoglobin: 9.7 g/dL — ABNORMAL LOW (ref 13.0–17.0)
MCH: 23.7 pg — ABNORMAL LOW (ref 26.0–34.0)
MCHC: 31.5 g/dL (ref 30.0–36.0)
MCV: 75.3 fL — ABNORMAL LOW (ref 80.0–100.0)
Platelets: 201 10*3/uL (ref 150–400)
RBC: 4.09 MIL/uL — ABNORMAL LOW (ref 4.22–5.81)
RDW: 13.9 % (ref 11.5–15.5)
WBC: 3.7 10*3/uL — ABNORMAL LOW (ref 4.0–10.5)
nRBC: 0 % (ref 0.0–0.2)

## 2020-02-09 LAB — BASIC METABOLIC PANEL
Anion gap: 7 (ref 5–15)
BUN: 43 mg/dL — ABNORMAL HIGH (ref 8–23)
CO2: 25 mmol/L (ref 22–32)
Calcium: 8.8 mg/dL — ABNORMAL LOW (ref 8.9–10.3)
Chloride: 103 mmol/L (ref 98–111)
Creatinine, Ser: 2.79 mg/dL — ABNORMAL HIGH (ref 0.61–1.24)
GFR calc Af Amer: 25 mL/min — ABNORMAL LOW (ref >60)
GFR calc non Af Amer: 21 mL/min — ABNORMAL LOW (ref >60)
Glucose, Bld: 98 mg/dL (ref 70–99)
Potassium: 4.4 mmol/L (ref 3.5–5.1)
Sodium: 135 mmol/L (ref 135–145)

## 2020-03-01 DIAGNOSIS — Z4502 Encounter for adjustment and management of automatic implantable cardiac defibrillator: Secondary | ICD-10-CM | POA: Diagnosis not present

## 2020-03-08 DIAGNOSIS — M542 Cervicalgia: Secondary | ICD-10-CM | POA: Diagnosis not present

## 2020-03-15 DIAGNOSIS — M542 Cervicalgia: Secondary | ICD-10-CM | POA: Diagnosis not present

## 2020-03-24 DIAGNOSIS — E869 Volume depletion, unspecified: Secondary | ICD-10-CM | POA: Diagnosis not present

## 2020-03-24 DIAGNOSIS — D509 Iron deficiency anemia, unspecified: Secondary | ICD-10-CM | POA: Diagnosis not present

## 2020-03-24 DIAGNOSIS — K922 Gastrointestinal hemorrhage, unspecified: Secondary | ICD-10-CM | POA: Diagnosis not present

## 2020-03-24 DIAGNOSIS — Z95811 Presence of heart assist device: Secondary | ICD-10-CM | POA: Diagnosis not present

## 2020-03-24 DIAGNOSIS — I480 Paroxysmal atrial fibrillation: Secondary | ICD-10-CM | POA: Diagnosis not present

## 2020-03-24 DIAGNOSIS — I130 Hypertensive heart and chronic kidney disease with heart failure and stage 1 through stage 4 chronic kidney disease, or unspecified chronic kidney disease: Secondary | ICD-10-CM | POA: Diagnosis not present

## 2020-03-24 DIAGNOSIS — N189 Chronic kidney disease, unspecified: Secondary | ICD-10-CM | POA: Diagnosis not present

## 2020-03-24 DIAGNOSIS — I5020 Unspecified systolic (congestive) heart failure: Secondary | ICD-10-CM | POA: Diagnosis not present

## 2020-03-24 DIAGNOSIS — M25519 Pain in unspecified shoulder: Secondary | ICD-10-CM | POA: Diagnosis not present

## 2020-03-24 DIAGNOSIS — Z87891 Personal history of nicotine dependence: Secondary | ICD-10-CM | POA: Diagnosis not present

## 2020-03-24 DIAGNOSIS — I5042 Chronic combined systolic (congestive) and diastolic (congestive) heart failure: Secondary | ICD-10-CM | POA: Diagnosis not present

## 2020-03-24 DIAGNOSIS — I429 Cardiomyopathy, unspecified: Secondary | ICD-10-CM | POA: Diagnosis not present

## 2020-03-24 DIAGNOSIS — N184 Chronic kidney disease, stage 4 (severe): Secondary | ICD-10-CM | POA: Diagnosis not present

## 2020-03-24 DIAGNOSIS — M542 Cervicalgia: Secondary | ICD-10-CM | POA: Diagnosis not present

## 2020-03-24 DIAGNOSIS — D649 Anemia, unspecified: Secondary | ICD-10-CM | POA: Diagnosis not present

## 2020-03-24 DIAGNOSIS — E039 Hypothyroidism, unspecified: Secondary | ICD-10-CM | POA: Diagnosis not present

## 2020-03-24 DIAGNOSIS — I5084 End stage heart failure: Secondary | ICD-10-CM | POA: Diagnosis not present

## 2020-03-24 DIAGNOSIS — E785 Hyperlipidemia, unspecified: Secondary | ICD-10-CM | POA: Diagnosis not present

## 2020-04-19 ENCOUNTER — Other Ambulatory Visit
Admission: RE | Admit: 2020-04-19 | Discharge: 2020-04-19 | Disposition: A | Discharge status: Home / Self Care | Payer: Medicare Other | Attending: Cardiovascular Disease | Admitting: Cardiovascular Disease

## 2020-04-19 ENCOUNTER — Other Ambulatory Visit: Payer: Self-pay

## 2020-04-19 DIAGNOSIS — I509 Heart failure, unspecified: Secondary | ICD-10-CM | POA: Insufficient documentation

## 2020-04-19 DIAGNOSIS — Z95811 Presence of heart assist device: Secondary | ICD-10-CM | POA: Diagnosis not present

## 2020-04-19 LAB — CBC
HCT: 30.4 % — ABNORMAL LOW (ref 39.0–52.0)
Hemoglobin: 9.5 g/dL — ABNORMAL LOW (ref 13.0–17.0)
MCH: 23.6 pg — ABNORMAL LOW (ref 26.0–34.0)
MCHC: 31.3 g/dL (ref 30.0–36.0)
MCV: 75.6 fL — ABNORMAL LOW (ref 80.0–100.0)
Platelets: 177 10*3/uL (ref 150–400)
RBC: 4.02 MIL/uL — ABNORMAL LOW (ref 4.22–5.81)
RDW: 14 % (ref 11.5–15.5)
WBC: 4.3 10*3/uL (ref 4.0–10.5)
nRBC: 0 % (ref 0.0–0.2)

## 2020-04-19 LAB — BASIC METABOLIC PANEL
Anion gap: 9 (ref 5–15)
BUN: 37 mg/dL — ABNORMAL HIGH (ref 8–23)
CO2: 24 mmol/L (ref 22–32)
Calcium: 8.8 mg/dL — ABNORMAL LOW (ref 8.9–10.3)
Chloride: 104 mmol/L (ref 98–111)
Creatinine, Ser: 2.76 mg/dL — ABNORMAL HIGH (ref 0.61–1.24)
GFR calc Af Amer: 25 mL/min — ABNORMAL LOW (ref >60)
GFR calc non Af Amer: 21 mL/min — ABNORMAL LOW (ref >60)
Glucose, Bld: 98 mg/dL (ref 70–99)
Potassium: 4.5 mmol/L (ref 3.5–5.1)
Sodium: 137 mmol/L (ref 135–145)

## 2020-05-16 ENCOUNTER — Other Ambulatory Visit: Payer: Self-pay

## 2020-05-16 ENCOUNTER — Other Ambulatory Visit
Admission: RE | Admit: 2020-05-16 | Discharge: 2020-05-16 | Disposition: A | Discharge status: Home / Self Care | Payer: Medicare Other | Attending: Cardiovascular Disease | Admitting: Cardiovascular Disease

## 2020-05-16 DIAGNOSIS — Z95811 Presence of heart assist device: Secondary | ICD-10-CM | POA: Diagnosis not present

## 2020-05-16 DIAGNOSIS — I509 Heart failure, unspecified: Secondary | ICD-10-CM | POA: Insufficient documentation

## 2020-05-16 LAB — CBC
HCT: 31 % — ABNORMAL LOW (ref 39.0–52.0)
Hemoglobin: 9.4 g/dL — ABNORMAL LOW (ref 13.0–17.0)
MCH: 23.7 pg — ABNORMAL LOW (ref 26.0–34.0)
MCHC: 30.3 g/dL (ref 30.0–36.0)
MCV: 78.1 fL — ABNORMAL LOW (ref 80.0–100.0)
Platelets: 202 10*3/uL (ref 150–400)
RBC: 3.97 MIL/uL — ABNORMAL LOW (ref 4.22–5.81)
RDW: 14 % (ref 11.5–15.5)
WBC: 5.1 10*3/uL (ref 4.0–10.5)
nRBC: 0 % (ref 0.0–0.2)

## 2020-05-16 LAB — BASIC METABOLIC PANEL
Anion gap: 7 (ref 5–15)
BUN: 34 mg/dL — ABNORMAL HIGH (ref 8–23)
CO2: 25 mmol/L (ref 22–32)
Calcium: 8.6 mg/dL — ABNORMAL LOW (ref 8.9–10.3)
Chloride: 105 mmol/L (ref 98–111)
Creatinine, Ser: 2.89 mg/dL — ABNORMAL HIGH (ref 0.61–1.24)
GFR calc Af Amer: 24 mL/min — ABNORMAL LOW (ref >60)
GFR calc non Af Amer: 20 mL/min — ABNORMAL LOW (ref >60)
Glucose, Bld: 58 mg/dL — ABNORMAL LOW (ref 70–99)
Potassium: 4.2 mmol/L (ref 3.5–5.1)
Sodium: 137 mmol/L (ref 135–145)

## 2020-05-31 DIAGNOSIS — Z4502 Encounter for adjustment and management of automatic implantable cardiac defibrillator: Secondary | ICD-10-CM | POA: Diagnosis not present

## 2020-06-12 ENCOUNTER — Other Ambulatory Visit
Admission: RE | Admit: 2020-06-12 | Discharge: 2020-06-12 | Disposition: A | Discharge status: Home / Self Care | Payer: Medicare Other | Attending: Cardiovascular Disease | Admitting: Cardiovascular Disease

## 2020-06-12 DIAGNOSIS — I509 Heart failure, unspecified: Secondary | ICD-10-CM | POA: Insufficient documentation

## 2020-06-12 DIAGNOSIS — Z95811 Presence of heart assist device: Secondary | ICD-10-CM | POA: Insufficient documentation

## 2020-06-12 LAB — CBC
HCT: 31.1 % — ABNORMAL LOW (ref 39.0–52.0)
Hemoglobin: 9.3 g/dL — ABNORMAL LOW (ref 13.0–17.0)
MCH: 23.5 pg — ABNORMAL LOW (ref 26.0–34.0)
MCHC: 29.9 g/dL — ABNORMAL LOW (ref 30.0–36.0)
MCV: 78.7 fL — ABNORMAL LOW (ref 80.0–100.0)
Platelets: 182 10*3/uL (ref 150–400)
RBC: 3.95 MIL/uL — ABNORMAL LOW (ref 4.22–5.81)
RDW: 14 % (ref 11.5–15.5)
WBC: 4.3 10*3/uL (ref 4.0–10.5)
nRBC: 0 % (ref 0.0–0.2)

## 2020-06-12 LAB — BASIC METABOLIC PANEL
Anion gap: 5 (ref 5–15)
BUN: 25 mg/dL — ABNORMAL HIGH (ref 8–23)
CO2: 28 mmol/L (ref 22–32)
Calcium: 8.7 mg/dL — ABNORMAL LOW (ref 8.9–10.3)
Chloride: 104 mmol/L (ref 98–111)
Creatinine, Ser: 2.65 mg/dL — ABNORMAL HIGH (ref 0.61–1.24)
GFR calc Af Amer: 26 mL/min — ABNORMAL LOW (ref >60)
GFR calc non Af Amer: 23 mL/min — ABNORMAL LOW (ref >60)
Glucose, Bld: 82 mg/dL (ref 70–99)
Potassium: 4.2 mmol/L (ref 3.5–5.1)
Sodium: 137 mmol/L (ref 135–145)

## 2020-07-13 ENCOUNTER — Other Ambulatory Visit
Admission: RE | Admit: 2020-07-13 | Discharge: 2020-07-13 | Disposition: A | Discharge status: Home / Self Care | Payer: Medicare Other | Attending: Cardiovascular Disease | Admitting: Cardiovascular Disease

## 2020-07-13 ENCOUNTER — Other Ambulatory Visit: Payer: Self-pay

## 2020-07-13 DIAGNOSIS — Z95811 Presence of heart assist device: Secondary | ICD-10-CM | POA: Insufficient documentation

## 2020-07-13 DIAGNOSIS — I509 Heart failure, unspecified: Secondary | ICD-10-CM | POA: Diagnosis not present

## 2020-07-13 LAB — CBC
HCT: 32.9 % — ABNORMAL LOW (ref 39.0–52.0)
Hemoglobin: 10 g/dL — ABNORMAL LOW (ref 13.0–17.0)
MCH: 23.7 pg — ABNORMAL LOW (ref 26.0–34.0)
MCHC: 30.4 g/dL (ref 30.0–36.0)
MCV: 78 fL — ABNORMAL LOW (ref 80.0–100.0)
Platelets: 203 10*3/uL (ref 150–400)
RBC: 4.22 MIL/uL (ref 4.22–5.81)
RDW: 13.6 % (ref 11.5–15.5)
WBC: 3.8 10*3/uL — ABNORMAL LOW (ref 4.0–10.5)
nRBC: 0 % (ref 0.0–0.2)

## 2020-07-13 LAB — BASIC METABOLIC PANEL
Anion gap: 6 (ref 5–15)
BUN: 30 mg/dL — ABNORMAL HIGH (ref 8–23)
CO2: 27 mmol/L (ref 22–32)
Calcium: 8.9 mg/dL (ref 8.9–10.3)
Chloride: 101 mmol/L (ref 98–111)
Creatinine, Ser: 2.56 mg/dL — ABNORMAL HIGH (ref 0.61–1.24)
GFR calc Af Amer: 27 mL/min — ABNORMAL LOW (ref >60)
GFR calc non Af Amer: 24 mL/min — ABNORMAL LOW (ref >60)
Glucose, Bld: 102 mg/dL — ABNORMAL HIGH (ref 70–99)
Potassium: 4.7 mmol/L (ref 3.5–5.1)
Sodium: 134 mmol/L — ABNORMAL LOW (ref 135–145)

## 2020-08-02 DIAGNOSIS — N184 Chronic kidney disease, stage 4 (severe): Secondary | ICD-10-CM | POA: Diagnosis not present

## 2020-08-02 DIAGNOSIS — Z95811 Presence of heart assist device: Secondary | ICD-10-CM | POA: Diagnosis not present

## 2020-08-02 DIAGNOSIS — I5042 Chronic combined systolic (congestive) and diastolic (congestive) heart failure: Secondary | ICD-10-CM | POA: Diagnosis not present

## 2020-08-02 DIAGNOSIS — D509 Iron deficiency anemia, unspecified: Secondary | ICD-10-CM | POA: Diagnosis not present

## 2020-08-02 DIAGNOSIS — I5022 Chronic systolic (congestive) heart failure: Secondary | ICD-10-CM | POA: Diagnosis not present

## 2020-08-14 DIAGNOSIS — I5022 Chronic systolic (congestive) heart failure: Secondary | ICD-10-CM | POA: Diagnosis not present

## 2020-08-14 DIAGNOSIS — Z4509 Encounter for adjustment and management of other cardiac device: Secondary | ICD-10-CM | POA: Diagnosis not present

## 2020-08-30 DIAGNOSIS — Z4502 Encounter for adjustment and management of automatic implantable cardiac defibrillator: Secondary | ICD-10-CM | POA: Diagnosis not present

## 2020-09-13 ENCOUNTER — Other Ambulatory Visit
Admission: RE | Admit: 2020-09-13 | Discharge: 2020-09-13 | Disposition: A | Discharge status: Home / Self Care | Payer: Medicare Other | Attending: Cardiovascular Disease | Admitting: Cardiovascular Disease

## 2020-09-13 DIAGNOSIS — I509 Heart failure, unspecified: Secondary | ICD-10-CM | POA: Diagnosis not present

## 2020-09-13 DIAGNOSIS — Z95811 Presence of heart assist device: Secondary | ICD-10-CM | POA: Diagnosis not present

## 2020-09-13 LAB — CBC WITH DIFFERENTIAL/PLATELET
Abs Immature Granulocytes: 0.03 10*3/uL (ref 0.00–0.07)
Basophils Absolute: 0 10*3/uL (ref 0.0–0.1)
Basophils Relative: 0 %
Eosinophils Absolute: 0.2 10*3/uL (ref 0.0–0.5)
Eosinophils Relative: 3 %
HCT: 31.7 % — ABNORMAL LOW (ref 39.0–52.0)
Hemoglobin: 9.7 g/dL — ABNORMAL LOW (ref 13.0–17.0)
Immature Granulocytes: 1 %
Lymphocytes Relative: 15 %
Lymphs Abs: 0.8 10*3/uL (ref 0.7–4.0)
MCH: 23.9 pg — ABNORMAL LOW (ref 26.0–34.0)
MCHC: 30.6 g/dL (ref 30.0–36.0)
MCV: 78.1 fL — ABNORMAL LOW (ref 80.0–100.0)
Monocytes Absolute: 0.5 10*3/uL (ref 0.1–1.0)
Monocytes Relative: 10 %
Neutro Abs: 3.7 10*3/uL (ref 1.7–7.7)
Neutrophils Relative %: 71 %
Platelets: 189 10*3/uL (ref 150–400)
RBC: 4.06 MIL/uL — ABNORMAL LOW (ref 4.22–5.81)
RDW: 13.8 % (ref 11.5–15.5)
WBC: 5.2 10*3/uL (ref 4.0–10.5)
nRBC: 0 % (ref 0.0–0.2)

## 2020-09-13 LAB — BASIC METABOLIC PANEL
Anion gap: 7 (ref 5–15)
BUN: 30 mg/dL — ABNORMAL HIGH (ref 8–23)
CO2: 25 mmol/L (ref 22–32)
Calcium: 8.8 mg/dL — ABNORMAL LOW (ref 8.9–10.3)
Chloride: 101 mmol/L (ref 98–111)
Creatinine, Ser: 2.75 mg/dL — ABNORMAL HIGH (ref 0.61–1.24)
GFR, Estimated: 23 mL/min — ABNORMAL LOW (ref >60)
Glucose, Bld: 152 mg/dL — ABNORMAL HIGH (ref 70–99)
Potassium: 4.5 mmol/L (ref 3.5–5.1)
Sodium: 133 mmol/L — ABNORMAL LOW (ref 135–145)

## 2020-11-03 DIAGNOSIS — I5022 Chronic systolic (congestive) heart failure: Secondary | ICD-10-CM | POA: Diagnosis not present

## 2020-11-03 DIAGNOSIS — N184 Chronic kidney disease, stage 4 (severe): Secondary | ICD-10-CM | POA: Diagnosis not present

## 2020-11-03 DIAGNOSIS — Z95811 Presence of heart assist device: Secondary | ICD-10-CM | POA: Diagnosis not present

## 2020-11-03 DIAGNOSIS — D509 Iron deficiency anemia, unspecified: Secondary | ICD-10-CM | POA: Diagnosis not present

## 2020-11-03 DIAGNOSIS — I5042 Chronic combined systolic (congestive) and diastolic (congestive) heart failure: Secondary | ICD-10-CM | POA: Diagnosis not present

## 2020-11-29 DIAGNOSIS — Z4502 Encounter for adjustment and management of automatic implantable cardiac defibrillator: Secondary | ICD-10-CM | POA: Diagnosis not present

## 2020-11-29 DIAGNOSIS — I499 Cardiac arrhythmia, unspecified: Secondary | ICD-10-CM | POA: Diagnosis not present

## 2020-12-21 ENCOUNTER — Other Ambulatory Visit
Admission: RE | Admit: 2020-12-21 | Discharge: 2020-12-21 | Disposition: A | Discharge status: Home / Self Care | Payer: Medicare Other | Attending: Cardiovascular Disease | Admitting: Cardiovascular Disease

## 2020-12-21 DIAGNOSIS — I509 Heart failure, unspecified: Secondary | ICD-10-CM | POA: Insufficient documentation

## 2020-12-21 DIAGNOSIS — Z95811 Presence of heart assist device: Secondary | ICD-10-CM | POA: Diagnosis not present

## 2020-12-21 LAB — CBC WITH DIFFERENTIAL/PLATELET
Abs Immature Granulocytes: 0.03 10*3/uL (ref 0.00–0.07)
Basophils Absolute: 0 10*3/uL (ref 0.0–0.1)
Basophils Relative: 1 %
Eosinophils Absolute: 0.2 10*3/uL (ref 0.0–0.5)
Eosinophils Relative: 4 %
HCT: 32.2 % — ABNORMAL LOW (ref 39.0–52.0)
Hemoglobin: 9.8 g/dL — ABNORMAL LOW (ref 13.0–17.0)
Immature Granulocytes: 1 %
Lymphocytes Relative: 24 %
Lymphs Abs: 1 10*3/uL (ref 0.7–4.0)
MCH: 23.7 pg — ABNORMAL LOW (ref 26.0–34.0)
MCHC: 30.4 g/dL (ref 30.0–36.0)
MCV: 77.8 fL — ABNORMAL LOW (ref 80.0–100.0)
Monocytes Absolute: 0.6 10*3/uL (ref 0.1–1.0)
Monocytes Relative: 15 %
Neutro Abs: 2.3 10*3/uL (ref 1.7–7.7)
Neutrophils Relative %: 55 %
Platelets: 170 10*3/uL (ref 150–400)
RBC: 4.14 MIL/uL — ABNORMAL LOW (ref 4.22–5.81)
RDW: 13.9 % (ref 11.5–15.5)
WBC: 4.1 10*3/uL (ref 4.0–10.5)
nRBC: 0 % (ref 0.0–0.2)

## 2020-12-21 LAB — BASIC METABOLIC PANEL
Anion gap: 11 (ref 5–15)
BUN: 31 mg/dL — ABNORMAL HIGH (ref 8–23)
CO2: 24 mmol/L (ref 22–32)
Calcium: 9.1 mg/dL (ref 8.9–10.3)
Chloride: 100 mmol/L (ref 98–111)
Creatinine, Ser: 2.77 mg/dL — ABNORMAL HIGH (ref 0.61–1.24)
GFR, Estimated: 23 mL/min — ABNORMAL LOW (ref >60)
Glucose, Bld: 97 mg/dL (ref 70–99)
Potassium: 4.5 mmol/L (ref 3.5–5.1)
Sodium: 135 mmol/L (ref 135–145)

## 2021-01-17 ENCOUNTER — Other Ambulatory Visit
Admission: RE | Admit: 2021-01-17 | Discharge: 2021-01-17 | Disposition: A | Discharge status: Home / Self Care | Payer: Medicare Other | Attending: Cardiovascular Disease | Admitting: Cardiovascular Disease

## 2021-01-17 ENCOUNTER — Other Ambulatory Visit: Payer: Self-pay

## 2021-01-17 DIAGNOSIS — Z95811 Presence of heart assist device: Secondary | ICD-10-CM | POA: Diagnosis not present

## 2021-01-17 DIAGNOSIS — I509 Heart failure, unspecified: Secondary | ICD-10-CM | POA: Diagnosis not present

## 2021-01-17 LAB — BASIC METABOLIC PANEL
Anion gap: 4 — ABNORMAL LOW (ref 5–15)
BUN: 30 mg/dL — ABNORMAL HIGH (ref 8–23)
CO2: 28 mmol/L (ref 22–32)
Calcium: 8.8 mg/dL — ABNORMAL LOW (ref 8.9–10.3)
Chloride: 104 mmol/L (ref 98–111)
Creatinine, Ser: 2.76 mg/dL — ABNORMAL HIGH (ref 0.61–1.24)
GFR, Estimated: 23 mL/min — ABNORMAL LOW (ref >60)
Glucose, Bld: 103 mg/dL — ABNORMAL HIGH (ref 70–99)
Potassium: 4.5 mmol/L (ref 3.5–5.1)
Sodium: 136 mmol/L (ref 135–145)

## 2021-01-17 LAB — CBC
HCT: 32.8 % — ABNORMAL LOW (ref 39.0–52.0)
Hemoglobin: 9.8 g/dL — ABNORMAL LOW (ref 13.0–17.0)
MCH: 23.4 pg — ABNORMAL LOW (ref 26.0–34.0)
MCHC: 29.9 g/dL — ABNORMAL LOW (ref 30.0–36.0)
MCV: 78.5 fL — ABNORMAL LOW (ref 80.0–100.0)
Platelets: 185 10*3/uL (ref 150–400)
RBC: 4.18 MIL/uL — ABNORMAL LOW (ref 4.22–5.81)
RDW: 14.1 % (ref 11.5–15.5)
WBC: 3.8 10*3/uL — ABNORMAL LOW (ref 4.0–10.5)
nRBC: 0 % (ref 0.0–0.2)

## 2021-02-15 DIAGNOSIS — Z95811 Presence of heart assist device: Secondary | ICD-10-CM | POA: Diagnosis not present

## 2021-02-16 DIAGNOSIS — T827XXA Infection and inflammatory reaction due to other cardiac and vascular devices, implants and grafts, initial encounter: Secondary | ICD-10-CM | POA: Diagnosis not present

## 2021-02-16 DIAGNOSIS — Z95811 Presence of heart assist device: Secondary | ICD-10-CM | POA: Diagnosis not present

## 2021-02-16 DIAGNOSIS — I5042 Chronic combined systolic (congestive) and diastolic (congestive) heart failure: Secondary | ICD-10-CM | POA: Diagnosis not present

## 2021-02-16 DIAGNOSIS — E039 Hypothyroidism, unspecified: Secondary | ICD-10-CM | POA: Diagnosis not present

## 2021-02-16 DIAGNOSIS — I5022 Chronic systolic (congestive) heart failure: Secondary | ICD-10-CM | POA: Diagnosis not present

## 2021-02-28 DIAGNOSIS — Z4502 Encounter for adjustment and management of automatic implantable cardiac defibrillator: Secondary | ICD-10-CM | POA: Diagnosis not present

## 2021-02-28 DIAGNOSIS — I5042 Chronic combined systolic (congestive) and diastolic (congestive) heart failure: Secondary | ICD-10-CM | POA: Diagnosis not present

## 2021-03-14 DIAGNOSIS — Z7689 Persons encountering health services in other specified circumstances: Secondary | ICD-10-CM | POA: Diagnosis not present

## 2021-03-14 DIAGNOSIS — Z79899 Other long term (current) drug therapy: Secondary | ICD-10-CM | POA: Diagnosis not present

## 2021-03-14 IMAGING — CR DG ANKLE COMPLETE 3+V*R*
3 series · 3 of 3 positions shown · non-contrast
Comparison: None.

CLINICAL DATA: 72-year-old male with a history of pain after a fall

EXAM:
RIGHT ANKLE - COMPLETE 3+ VIEW

[ankle ap]
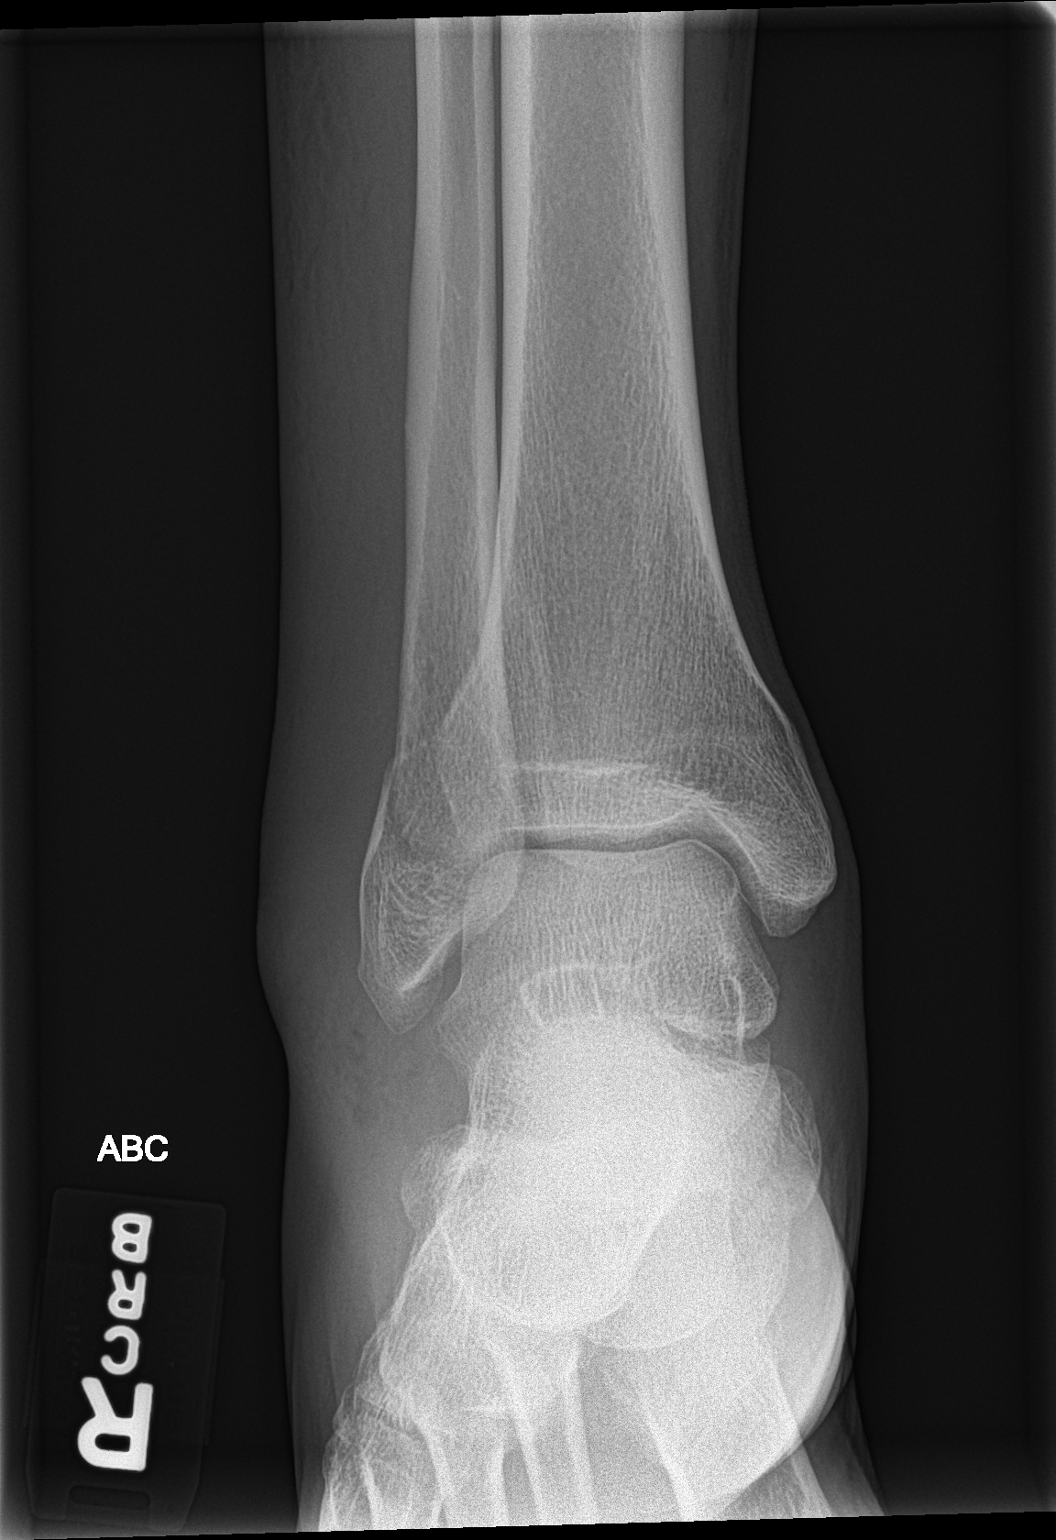

[ankle obl]
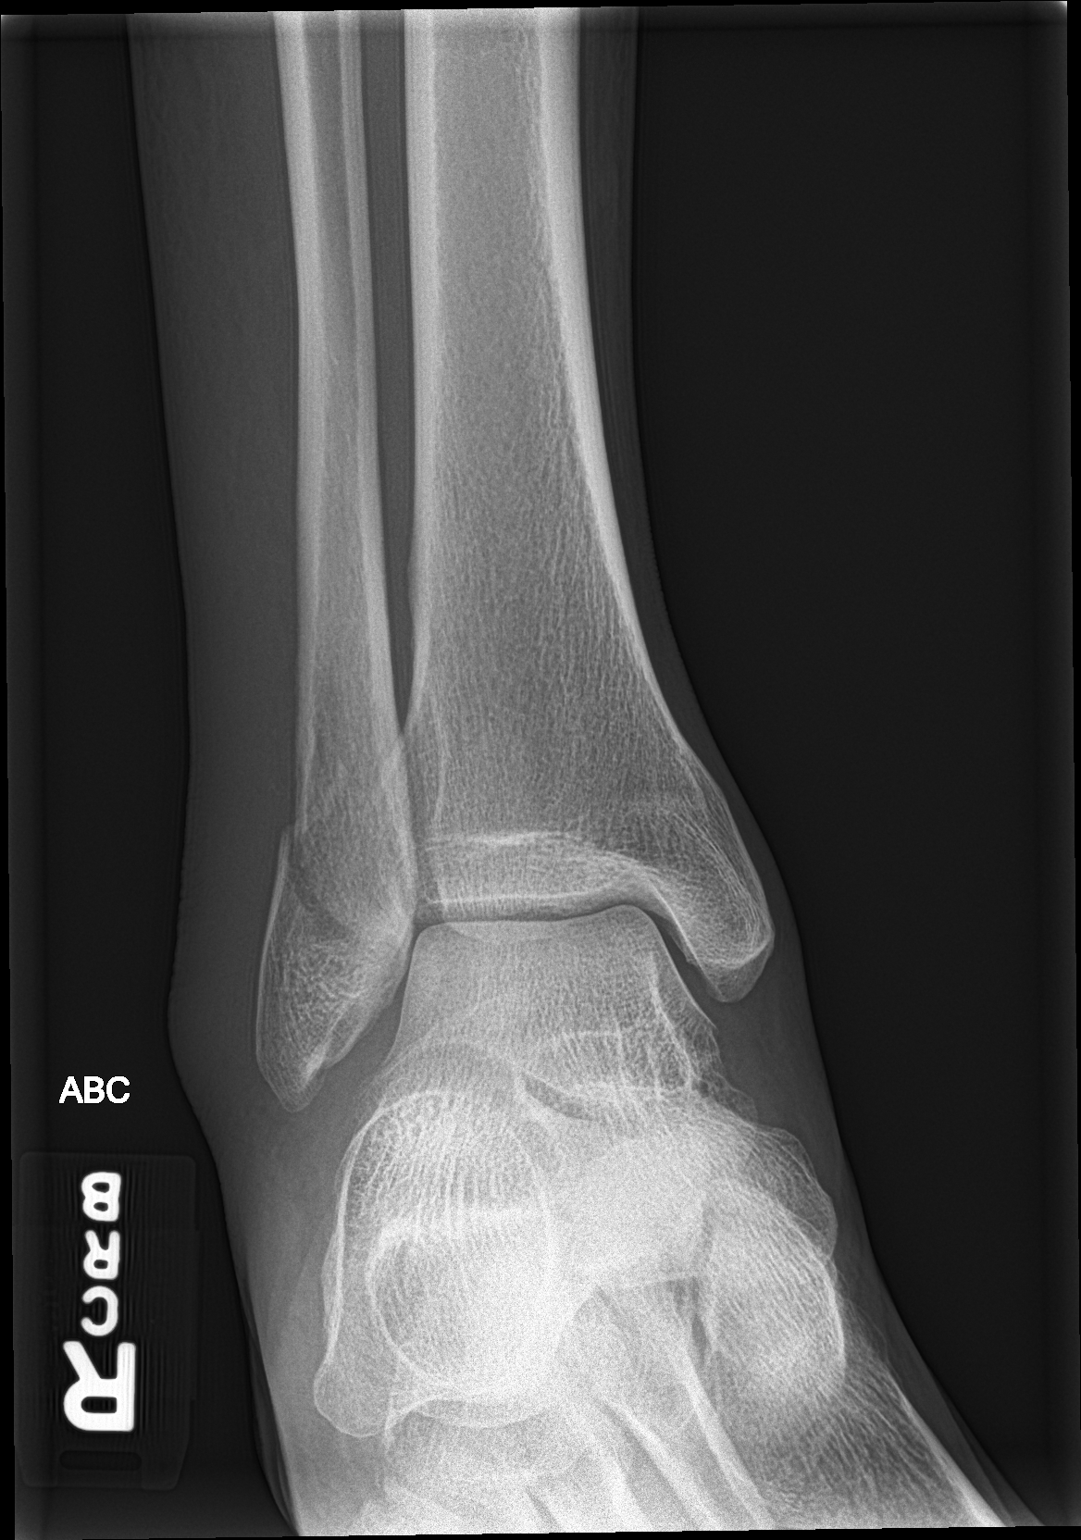

[ankle lat]
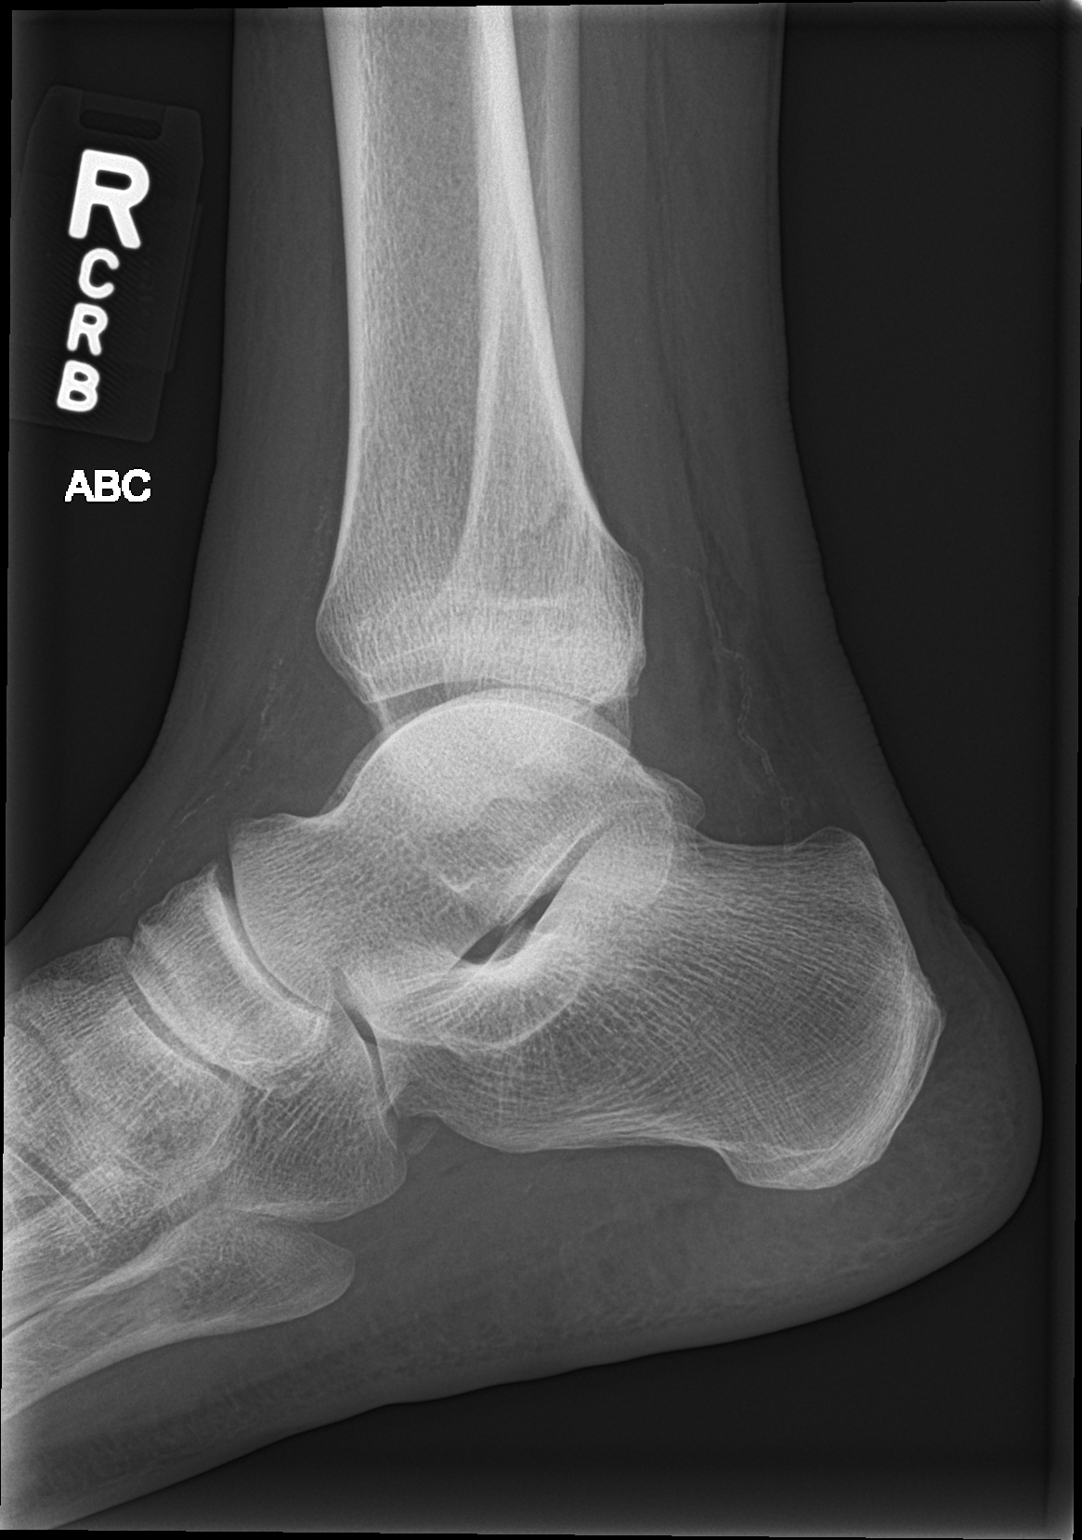

[3 of 3 positions shown; findings below may reference images not displayed]

FINDINGS: Acute fracture of the distal fibula at the level of the ankle
mortise. Ankle mortise appears congruent. Soft tissue swelling on
the lateral ankle.

Vascular calcifications
IMPRESSION: Acute fracture of the distal fibula at the level of the ankle
mortise. Associated soft tissue swelling.

## 2021-03-15 DIAGNOSIS — Z7689 Persons encountering health services in other specified circumstances: Secondary | ICD-10-CM | POA: Diagnosis not present

## 2021-03-16 DIAGNOSIS — Z888 Allergy status to other drugs, medicaments and biological substances status: Secondary | ICD-10-CM | POA: Diagnosis not present

## 2021-03-16 DIAGNOSIS — Z8249 Family history of ischemic heart disease and other diseases of the circulatory system: Secondary | ICD-10-CM | POA: Diagnosis not present

## 2021-03-16 DIAGNOSIS — Z95811 Presence of heart assist device: Secondary | ICD-10-CM | POA: Diagnosis not present

## 2021-03-16 DIAGNOSIS — N189 Chronic kidney disease, unspecified: Secondary | ICD-10-CM | POA: Diagnosis not present

## 2021-03-16 DIAGNOSIS — Z886 Allergy status to analgesic agent status: Secondary | ICD-10-CM | POA: Diagnosis not present

## 2021-03-16 DIAGNOSIS — I5084 End stage heart failure: Secondary | ICD-10-CM | POA: Diagnosis not present

## 2021-03-16 DIAGNOSIS — I130 Hypertensive heart and chronic kidney disease with heart failure and stage 1 through stage 4 chronic kidney disease, or unspecified chronic kidney disease: Secondary | ICD-10-CM | POA: Diagnosis not present

## 2021-03-16 DIAGNOSIS — I5020 Unspecified systolic (congestive) heart failure: Secondary | ICD-10-CM | POA: Diagnosis not present

## 2021-03-16 DIAGNOSIS — E039 Hypothyroidism, unspecified: Secondary | ICD-10-CM | POA: Diagnosis not present

## 2021-03-16 DIAGNOSIS — Z95810 Presence of automatic (implantable) cardiac defibrillator: Secondary | ICD-10-CM | POA: Diagnosis not present

## 2021-03-16 DIAGNOSIS — I480 Paroxysmal atrial fibrillation: Secondary | ICD-10-CM | POA: Diagnosis not present

## 2021-03-16 DIAGNOSIS — R9431 Abnormal electrocardiogram [ECG] [EKG]: Secondary | ICD-10-CM | POA: Diagnosis not present

## 2021-03-16 DIAGNOSIS — D649 Anemia, unspecified: Secondary | ICD-10-CM | POA: Diagnosis not present

## 2021-03-16 DIAGNOSIS — Z4502 Encounter for adjustment and management of automatic implantable cardiac defibrillator: Secondary | ICD-10-CM | POA: Diagnosis not present

## 2021-03-16 DIAGNOSIS — Z79899 Other long term (current) drug therapy: Secondary | ICD-10-CM | POA: Diagnosis not present

## 2021-03-16 DIAGNOSIS — Z87891 Personal history of nicotine dependence: Secondary | ICD-10-CM | POA: Diagnosis not present

## 2021-03-16 DIAGNOSIS — I428 Other cardiomyopathies: Secondary | ICD-10-CM | POA: Diagnosis not present

## 2021-03-16 DIAGNOSIS — K219 Gastro-esophageal reflux disease without esophagitis: Secondary | ICD-10-CM | POA: Diagnosis not present

## 2021-04-02 ENCOUNTER — Other Ambulatory Visit
Admission: RE | Admit: 2021-04-02 | Discharge: 2021-04-02 | Disposition: A | Discharge status: Home / Self Care | Payer: Medicare Other | Attending: Cardiovascular Disease | Admitting: Cardiovascular Disease

## 2021-04-02 DIAGNOSIS — I509 Heart failure, unspecified: Secondary | ICD-10-CM | POA: Insufficient documentation

## 2021-04-02 DIAGNOSIS — Z95811 Presence of heart assist device: Secondary | ICD-10-CM | POA: Insufficient documentation

## 2021-04-02 LAB — BASIC METABOLIC PANEL
Anion gap: 5 (ref 5–15)
BUN: 37 mg/dL — ABNORMAL HIGH (ref 8–23)
CO2: 26 mmol/L (ref 22–32)
Calcium: 9.1 mg/dL (ref 8.9–10.3)
Chloride: 104 mmol/L (ref 98–111)
Creatinine, Ser: 3.34 mg/dL — ABNORMAL HIGH (ref 0.61–1.24)
GFR, Estimated: 18 mL/min — ABNORMAL LOW (ref >60)
Glucose, Bld: 87 mg/dL (ref 70–99)
Potassium: 4.5 mmol/L (ref 3.5–5.1)
Sodium: 135 mmol/L (ref 135–145)

## 2021-04-02 LAB — CBC
HCT: 30.5 % — ABNORMAL LOW (ref 39.0–52.0)
Hemoglobin: 9.5 g/dL — ABNORMAL LOW (ref 13.0–17.0)
MCH: 24 pg — ABNORMAL LOW (ref 26.0–34.0)
MCHC: 31.1 g/dL (ref 30.0–36.0)
MCV: 77 fL — ABNORMAL LOW (ref 80.0–100.0)
Platelets: 180 10*3/uL (ref 150–400)
RBC: 3.96 MIL/uL — ABNORMAL LOW (ref 4.22–5.81)
RDW: 14 % (ref 11.5–15.5)
WBC: 5.6 10*3/uL (ref 4.0–10.5)
nRBC: 0 % (ref 0.0–0.2)

## 2021-04-17 ENCOUNTER — Other Ambulatory Visit: Payer: Self-pay

## 2021-04-17 ENCOUNTER — Other Ambulatory Visit
Admission: RE | Admit: 2021-04-17 | Discharge: 2021-04-17 | Disposition: A | Discharge status: Home / Self Care | Payer: Medicare Other | Attending: Cardiovascular Disease | Admitting: Cardiovascular Disease

## 2021-04-17 DIAGNOSIS — I509 Heart failure, unspecified: Secondary | ICD-10-CM | POA: Insufficient documentation

## 2021-04-17 DIAGNOSIS — Z95811 Presence of heart assist device: Secondary | ICD-10-CM | POA: Diagnosis not present

## 2021-04-17 LAB — BASIC METABOLIC PANEL
Anion gap: 5 (ref 5–15)
BUN: 29 mg/dL — ABNORMAL HIGH (ref 8–23)
CO2: 26 mmol/L (ref 22–32)
Calcium: 8.8 mg/dL — ABNORMAL LOW (ref 8.9–10.3)
Chloride: 103 mmol/L (ref 98–111)
Creatinine, Ser: 2.68 mg/dL — ABNORMAL HIGH (ref 0.61–1.24)
GFR, Estimated: 24 mL/min — ABNORMAL LOW (ref >60)
Glucose, Bld: 103 mg/dL — ABNORMAL HIGH (ref 70–99)
Potassium: 3.8 mmol/L (ref 3.5–5.1)
Sodium: 134 mmol/L — ABNORMAL LOW (ref 135–145)

## 2021-05-01 DIAGNOSIS — Z7689 Persons encountering health services in other specified circumstances: Secondary | ICD-10-CM | POA: Diagnosis not present

## 2021-05-25 DIAGNOSIS — R5383 Other fatigue: Secondary | ICD-10-CM | POA: Diagnosis not present

## 2021-05-25 DIAGNOSIS — E785 Hyperlipidemia, unspecified: Secondary | ICD-10-CM | POA: Diagnosis not present

## 2021-05-25 DIAGNOSIS — I480 Paroxysmal atrial fibrillation: Secondary | ICD-10-CM | POA: Diagnosis not present

## 2021-05-25 DIAGNOSIS — E039 Hypothyroidism, unspecified: Secondary | ICD-10-CM | POA: Diagnosis not present

## 2021-05-25 DIAGNOSIS — Z79899 Other long term (current) drug therapy: Secondary | ICD-10-CM | POA: Diagnosis not present

## 2021-05-25 DIAGNOSIS — Z95811 Presence of heart assist device: Secondary | ICD-10-CM | POA: Diagnosis not present

## 2021-05-25 DIAGNOSIS — K219 Gastro-esophageal reflux disease without esophagitis: Secondary | ICD-10-CM | POA: Diagnosis not present

## 2021-05-25 DIAGNOSIS — N184 Chronic kidney disease, stage 4 (severe): Secondary | ICD-10-CM | POA: Diagnosis not present

## 2021-05-25 DIAGNOSIS — D631 Anemia in chronic kidney disease: Secondary | ICD-10-CM | POA: Diagnosis not present

## 2021-05-25 DIAGNOSIS — Z8719 Personal history of other diseases of the digestive system: Secondary | ICD-10-CM | POA: Diagnosis not present

## 2021-05-25 DIAGNOSIS — I428 Other cardiomyopathies: Secondary | ICD-10-CM | POA: Diagnosis not present

## 2021-05-25 DIAGNOSIS — E74810 Glucose transporter protein type 1 deficiency: Secondary | ICD-10-CM | POA: Diagnosis not present

## 2021-05-25 DIAGNOSIS — Z95810 Presence of automatic (implantable) cardiac defibrillator: Secondary | ICD-10-CM | POA: Diagnosis not present

## 2021-05-25 DIAGNOSIS — D649 Anemia, unspecified: Secondary | ICD-10-CM | POA: Diagnosis not present

## 2021-05-25 DIAGNOSIS — Z7901 Long term (current) use of anticoagulants: Secondary | ICD-10-CM | POA: Diagnosis not present

## 2021-05-25 DIAGNOSIS — I5022 Chronic systolic (congestive) heart failure: Secondary | ICD-10-CM | POA: Diagnosis not present

## 2021-05-25 DIAGNOSIS — I5042 Chronic combined systolic (congestive) and diastolic (congestive) heart failure: Secondary | ICD-10-CM | POA: Diagnosis not present

## 2021-05-25 DIAGNOSIS — Z23 Encounter for immunization: Secondary | ICD-10-CM | POA: Diagnosis not present

## 2021-05-25 DIAGNOSIS — R42 Dizziness and giddiness: Secondary | ICD-10-CM | POA: Diagnosis not present

## 2021-05-30 DIAGNOSIS — Z4502 Encounter for adjustment and management of automatic implantable cardiac defibrillator: Secondary | ICD-10-CM | POA: Diagnosis not present

## 2021-06-21 DIAGNOSIS — Z95811 Presence of heart assist device: Secondary | ICD-10-CM | POA: Diagnosis not present

## 2021-07-17 ENCOUNTER — Other Ambulatory Visit
Admission: RE | Admit: 2021-07-17 | Discharge: 2021-07-17 | Disposition: A | Discharge status: Home / Self Care | Payer: Medicare Other | Attending: Cardiovascular Disease | Admitting: Cardiovascular Disease

## 2021-07-17 ENCOUNTER — Other Ambulatory Visit: Payer: Self-pay

## 2021-07-17 DIAGNOSIS — I509 Heart failure, unspecified: Secondary | ICD-10-CM | POA: Diagnosis not present

## 2021-07-17 DIAGNOSIS — Z95811 Presence of heart assist device: Secondary | ICD-10-CM | POA: Insufficient documentation

## 2021-07-17 LAB — BASIC METABOLIC PANEL
Anion gap: 4 — ABNORMAL LOW (ref 5–15)
BUN: 26 mg/dL — ABNORMAL HIGH (ref 8–23)
CO2: 27 mmol/L (ref 22–32)
Calcium: 8.9 mg/dL (ref 8.9–10.3)
Chloride: 103 mmol/L (ref 98–111)
Creatinine, Ser: 2.52 mg/dL — ABNORMAL HIGH (ref 0.61–1.24)
GFR, Estimated: 26 mL/min — ABNORMAL LOW (ref >60)
Glucose, Bld: 97 mg/dL (ref 70–99)
Potassium: 4.7 mmol/L (ref 3.5–5.1)
Sodium: 134 mmol/L — ABNORMAL LOW (ref 135–145)

## 2021-07-17 LAB — CBC
HCT: 30.6 % — ABNORMAL LOW (ref 39.0–52.0)
Hemoglobin: 9.5 g/dL — ABNORMAL LOW (ref 13.0–17.0)
MCH: 24 pg — ABNORMAL LOW (ref 26.0–34.0)
MCHC: 31 g/dL (ref 30.0–36.0)
MCV: 77.3 fL — ABNORMAL LOW (ref 80.0–100.0)
Platelets: 199 10*3/uL (ref 150–400)
RBC: 3.96 MIL/uL — ABNORMAL LOW (ref 4.22–5.81)
RDW: 14.5 % (ref 11.5–15.5)
WBC: 4.8 10*3/uL (ref 4.0–10.5)
nRBC: 0 % (ref 0.0–0.2)

## 2021-07-17 LAB — TSH: TSH: 2.901 u[IU]/mL (ref 0.350–4.500)

## 2021-07-17 LAB — DIGOXIN LEVEL: Digoxin Level: 0.9 ng/mL (ref 0.8–2.0)

## 2021-08-24 DIAGNOSIS — N184 Chronic kidney disease, stage 4 (severe): Secondary | ICD-10-CM | POA: Diagnosis not present

## 2021-08-24 DIAGNOSIS — I5042 Chronic combined systolic (congestive) and diastolic (congestive) heart failure: Secondary | ICD-10-CM | POA: Diagnosis not present

## 2021-08-24 DIAGNOSIS — E039 Hypothyroidism, unspecified: Secondary | ICD-10-CM | POA: Diagnosis not present

## 2021-08-24 DIAGNOSIS — D631 Anemia in chronic kidney disease: Secondary | ICD-10-CM | POA: Diagnosis not present

## 2021-08-24 DIAGNOSIS — Z95811 Presence of heart assist device: Secondary | ICD-10-CM | POA: Diagnosis not present

## 2021-08-24 DIAGNOSIS — I5084 End stage heart failure: Secondary | ICD-10-CM | POA: Diagnosis not present

## 2021-08-24 DIAGNOSIS — I130 Hypertensive heart and chronic kidney disease with heart failure and stage 1 through stage 4 chronic kidney disease, or unspecified chronic kidney disease: Secondary | ICD-10-CM | POA: Diagnosis not present

## 2021-08-24 DIAGNOSIS — I499 Cardiac arrhythmia, unspecified: Secondary | ICD-10-CM | POA: Diagnosis not present

## 2021-08-24 DIAGNOSIS — Z79899 Other long term (current) drug therapy: Secondary | ICD-10-CM | POA: Diagnosis not present

## 2021-08-24 DIAGNOSIS — Z87891 Personal history of nicotine dependence: Secondary | ICD-10-CM | POA: Diagnosis not present

## 2021-08-24 DIAGNOSIS — I428 Other cardiomyopathies: Secondary | ICD-10-CM | POA: Diagnosis not present

## 2021-08-24 DIAGNOSIS — E785 Hyperlipidemia, unspecified: Secondary | ICD-10-CM | POA: Diagnosis not present

## 2021-08-29 DIAGNOSIS — Z4502 Encounter for adjustment and management of automatic implantable cardiac defibrillator: Secondary | ICD-10-CM | POA: Diagnosis not present

## 2021-10-03 ENCOUNTER — Other Ambulatory Visit
Admission: RE | Admit: 2021-10-03 | Discharge: 2021-10-03 | Disposition: A | Discharge status: Home / Self Care | Payer: Medicare Other | Attending: Cardiovascular Disease | Admitting: Cardiovascular Disease

## 2021-10-03 ENCOUNTER — Other Ambulatory Visit: Payer: Self-pay

## 2021-10-03 DIAGNOSIS — I509 Heart failure, unspecified: Secondary | ICD-10-CM | POA: Insufficient documentation

## 2021-10-03 DIAGNOSIS — Z95811 Presence of heart assist device: Secondary | ICD-10-CM | POA: Diagnosis not present

## 2021-10-03 LAB — CBC
HCT: 34.3 % — ABNORMAL LOW (ref 39.0–52.0)
Hemoglobin: 10.4 g/dL — ABNORMAL LOW (ref 13.0–17.0)
MCH: 23.7 pg — ABNORMAL LOW (ref 26.0–34.0)
MCHC: 30.3 g/dL (ref 30.0–36.0)
MCV: 78.1 fL — ABNORMAL LOW (ref 80.0–100.0)
Platelets: 196 10*3/uL (ref 150–400)
RBC: 4.39 MIL/uL (ref 4.22–5.81)
RDW: 13.9 % (ref 11.5–15.5)
WBC: 4 10*3/uL (ref 4.0–10.5)
nRBC: 0 % (ref 0.0–0.2)

## 2021-10-03 LAB — BASIC METABOLIC PANEL
Anion gap: 5 (ref 5–15)
BUN: 30 mg/dL — ABNORMAL HIGH (ref 8–23)
CO2: 27 mmol/L (ref 22–32)
Calcium: 8.8 mg/dL — ABNORMAL LOW (ref 8.9–10.3)
Chloride: 103 mmol/L (ref 98–111)
Creatinine, Ser: 2.47 mg/dL — ABNORMAL HIGH (ref 0.61–1.24)
GFR, Estimated: 26 mL/min — ABNORMAL LOW (ref >60)
Glucose, Bld: 128 mg/dL — ABNORMAL HIGH (ref 70–99)
Potassium: 4 mmol/L (ref 3.5–5.1)
Sodium: 135 mmol/L (ref 135–145)

## 2021-10-03 LAB — IRON AND TIBC
Iron: 106 ug/dL (ref 45–182)
Saturation Ratios: 36 % (ref 17.9–39.5)
TIBC: 291 ug/dL (ref 250–450)
UIBC: 185 ug/dL

## 2021-10-03 LAB — FERRITIN: Ferritin: 135 ng/mL (ref 24–336)

## 2021-10-30 ENCOUNTER — Other Ambulatory Visit
Admission: RE | Admit: 2021-10-30 | Discharge: 2021-10-30 | Disposition: A | Discharge status: Home / Self Care | Payer: Medicare Other | Attending: Cardiovascular Disease | Admitting: Cardiovascular Disease

## 2021-10-30 DIAGNOSIS — Z95811 Presence of heart assist device: Secondary | ICD-10-CM | POA: Insufficient documentation

## 2021-10-30 DIAGNOSIS — I509 Heart failure, unspecified: Secondary | ICD-10-CM | POA: Insufficient documentation

## 2021-10-30 LAB — BASIC METABOLIC PANEL
Anion gap: 4 — ABNORMAL LOW (ref 5–15)
BUN: 36 mg/dL — ABNORMAL HIGH (ref 8–23)
CO2: 26 mmol/L (ref 22–32)
Calcium: 9.1 mg/dL (ref 8.9–10.3)
Chloride: 101 mmol/L (ref 98–111)
Creatinine, Ser: 2.77 mg/dL — ABNORMAL HIGH (ref 0.61–1.24)
GFR, Estimated: 23 mL/min — ABNORMAL LOW (ref >60)
Glucose, Bld: 104 mg/dL — ABNORMAL HIGH (ref 70–99)
Potassium: 4.4 mmol/L (ref 3.5–5.1)
Sodium: 131 mmol/L — ABNORMAL LOW (ref 135–145)

## 2021-10-30 LAB — CBC
HCT: 31.8 % — ABNORMAL LOW (ref 39.0–52.0)
Hemoglobin: 9.6 g/dL — ABNORMAL LOW (ref 13.0–17.0)
MCH: 23.9 pg — ABNORMAL LOW (ref 26.0–34.0)
MCHC: 30.2 g/dL (ref 30.0–36.0)
MCV: 79.1 fL — ABNORMAL LOW (ref 80.0–100.0)
Platelets: 179 10*3/uL (ref 150–400)
RBC: 4.02 MIL/uL — ABNORMAL LOW (ref 4.22–5.81)
RDW: 14 % (ref 11.5–15.5)
WBC: 3.6 10*3/uL — ABNORMAL LOW (ref 4.0–10.5)
nRBC: 0 % (ref 0.0–0.2)

## 2021-11-27 ENCOUNTER — Other Ambulatory Visit
Admission: RE | Admit: 2021-11-27 | Discharge: 2021-11-27 | Disposition: A | Discharge status: Home / Self Care | Payer: Medicare Other | Attending: Cardiovascular Disease | Admitting: Cardiovascular Disease

## 2021-11-27 ENCOUNTER — Other Ambulatory Visit: Payer: Self-pay

## 2021-11-27 DIAGNOSIS — Z95811 Presence of heart assist device: Secondary | ICD-10-CM | POA: Insufficient documentation

## 2021-11-27 DIAGNOSIS — I509 Heart failure, unspecified: Secondary | ICD-10-CM | POA: Diagnosis not present

## 2021-11-27 LAB — CBC
HCT: 31.5 % — ABNORMAL LOW (ref 39.0–52.0)
Hemoglobin: 9.7 g/dL — ABNORMAL LOW (ref 13.0–17.0)
MCH: 24.1 pg — ABNORMAL LOW (ref 26.0–34.0)
MCHC: 30.8 g/dL (ref 30.0–36.0)
MCV: 78.4 fL — ABNORMAL LOW (ref 80.0–100.0)
Platelets: 191 10*3/uL (ref 150–400)
RBC: 4.02 MIL/uL — ABNORMAL LOW (ref 4.22–5.81)
RDW: 14.1 % (ref 11.5–15.5)
WBC: 3.9 10*3/uL — ABNORMAL LOW (ref 4.0–10.5)
nRBC: 0 % (ref 0.0–0.2)

## 2021-11-27 LAB — BASIC METABOLIC PANEL
Anion gap: 9 (ref 5–15)
BUN: 32 mg/dL — ABNORMAL HIGH (ref 8–23)
CO2: 25 mmol/L (ref 22–32)
Calcium: 8.7 mg/dL — ABNORMAL LOW (ref 8.9–10.3)
Chloride: 99 mmol/L (ref 98–111)
Creatinine, Ser: 2.59 mg/dL — ABNORMAL HIGH (ref 0.61–1.24)
GFR, Estimated: 25 mL/min — ABNORMAL LOW (ref >60)
Glucose, Bld: 108 mg/dL — ABNORMAL HIGH (ref 70–99)
Potassium: 4.1 mmol/L (ref 3.5–5.1)
Sodium: 133 mmol/L — ABNORMAL LOW (ref 135–145)

## 2021-11-28 DIAGNOSIS — Z4502 Encounter for adjustment and management of automatic implantable cardiac defibrillator: Secondary | ICD-10-CM | POA: Diagnosis not present

## 2021-12-28 DIAGNOSIS — Z95811 Presence of heart assist device: Secondary | ICD-10-CM | POA: Diagnosis not present

## 2021-12-28 DIAGNOSIS — D631 Anemia in chronic kidney disease: Secondary | ICD-10-CM | POA: Diagnosis not present

## 2021-12-28 DIAGNOSIS — N184 Chronic kidney disease, stage 4 (severe): Secondary | ICD-10-CM | POA: Diagnosis not present

## 2021-12-28 DIAGNOSIS — I5042 Chronic combined systolic (congestive) and diastolic (congestive) heart failure: Secondary | ICD-10-CM | POA: Diagnosis not present

## 2022-02-08 DIAGNOSIS — R319 Hematuria, unspecified: Secondary | ICD-10-CM | POA: Diagnosis not present

## 2022-02-08 DIAGNOSIS — I5042 Chronic combined systolic (congestive) and diastolic (congestive) heart failure: Secondary | ICD-10-CM | POA: Diagnosis not present

## 2022-02-11 ENCOUNTER — Other Ambulatory Visit
Admission: RE | Admit: 2022-02-11 | Discharge: 2022-02-11 | Disposition: A | Discharge status: Home / Self Care | Payer: Medicare Other | Attending: Cardiovascular Disease | Admitting: Cardiovascular Disease

## 2022-02-11 DIAGNOSIS — I509 Heart failure, unspecified: Secondary | ICD-10-CM | POA: Diagnosis not present

## 2022-02-11 DIAGNOSIS — Z95811 Presence of heart assist device: Secondary | ICD-10-CM | POA: Insufficient documentation

## 2022-02-11 LAB — CBC
HCT: 29.8 % — ABNORMAL LOW (ref 39.0–52.0)
Hemoglobin: 9.3 g/dL — ABNORMAL LOW (ref 13.0–17.0)
MCH: 24.2 pg — ABNORMAL LOW (ref 26.0–34.0)
MCHC: 31.2 g/dL (ref 30.0–36.0)
MCV: 77.4 fL — ABNORMAL LOW (ref 80.0–100.0)
Platelets: 197 10*3/uL (ref 150–400)
RBC: 3.85 MIL/uL — ABNORMAL LOW (ref 4.22–5.81)
RDW: 14.3 % (ref 11.5–15.5)
WBC: 4.3 10*3/uL (ref 4.0–10.5)
nRBC: 0 % (ref 0.0–0.2)

## 2022-02-11 LAB — COMPREHENSIVE METABOLIC PANEL
ALT: 14 U/L (ref 0–44)
AST: 22 U/L (ref 15–41)
Albumin: 3.8 g/dL (ref 3.5–5.0)
Alkaline Phosphatase: 43 U/L (ref 38–126)
Anion gap: 5 (ref 5–15)
BUN: 32 mg/dL — ABNORMAL HIGH (ref 8–23)
CO2: 27 mmol/L (ref 22–32)
Calcium: 9 mg/dL (ref 8.9–10.3)
Chloride: 102 mmol/L (ref 98–111)
Creatinine, Ser: 2.63 mg/dL — ABNORMAL HIGH (ref 0.61–1.24)
GFR, Estimated: 24 mL/min — ABNORMAL LOW (ref >60)
Glucose, Bld: 80 mg/dL (ref 70–99)
Potassium: 4.5 mmol/L (ref 3.5–5.1)
Sodium: 134 mmol/L — ABNORMAL LOW (ref 135–145)
Total Bilirubin: 1 mg/dL (ref 0.3–1.2)
Total Protein: 7 g/dL (ref 6.5–8.1)

## 2022-02-11 LAB — URINALYSIS, ROUTINE W REFLEX MICROSCOPIC
Bilirubin Urine: NEGATIVE
Glucose, UA: NEGATIVE mg/dL
Ketones, ur: NEGATIVE mg/dL
Leukocytes,Ua: NEGATIVE
Nitrite: NEGATIVE
Protein, ur: NEGATIVE mg/dL
Specific Gravity, Urine: 1.015 (ref 1.005–1.030)
pH: 5.5 (ref 5.0–8.0)

## 2022-02-11 LAB — PROTIME-INR
INR: 1.1 (ref 0.8–1.2)
Prothrombin Time: 14.3 seconds (ref 11.4–15.2)

## 2022-02-11 LAB — URINALYSIS, MICROSCOPIC (REFLEX): RBC / HPF: >50 RBC/hpf (ref 0–5)

## 2022-02-11 LAB — LACTATE DEHYDROGENASE: LDH: 195 U/L — ABNORMAL HIGH (ref 98–192)

## 2022-02-15 DIAGNOSIS — R319 Hematuria, unspecified: Secondary | ICD-10-CM | POA: Diagnosis not present

## 2022-02-26 ENCOUNTER — Other Ambulatory Visit
Admission: RE | Admit: 2022-02-26 | Discharge: 2022-02-26 | Disposition: A | Discharge status: Home / Self Care | Payer: Medicare Other | Attending: Cardiovascular Disease | Admitting: Cardiovascular Disease

## 2022-02-26 DIAGNOSIS — Z95811 Presence of heart assist device: Secondary | ICD-10-CM | POA: Diagnosis not present

## 2022-02-26 DIAGNOSIS — I509 Heart failure, unspecified: Secondary | ICD-10-CM | POA: Insufficient documentation

## 2022-02-26 LAB — COMPREHENSIVE METABOLIC PANEL
ALT: 11 U/L (ref 0–44)
AST: 20 U/L (ref 15–41)
Albumin: 3.9 g/dL (ref 3.5–5.0)
Alkaline Phosphatase: 48 U/L (ref 38–126)
Anion gap: 6 (ref 5–15)
BUN: 36 mg/dL — ABNORMAL HIGH (ref 8–23)
CO2: 26 mmol/L (ref 22–32)
Calcium: 9 mg/dL (ref 8.9–10.3)
Chloride: 101 mmol/L (ref 98–111)
Creatinine, Ser: 3.03 mg/dL — ABNORMAL HIGH (ref 0.61–1.24)
GFR, Estimated: 20 mL/min — ABNORMAL LOW (ref >60)
Glucose, Bld: 102 mg/dL — ABNORMAL HIGH (ref 70–99)
Potassium: 4.2 mmol/L (ref 3.5–5.1)
Sodium: 133 mmol/L — ABNORMAL LOW (ref 135–145)
Total Bilirubin: 1.1 mg/dL (ref 0.3–1.2)
Total Protein: 7.3 g/dL (ref 6.5–8.1)

## 2022-02-26 LAB — URINALYSIS, COMPLETE (UACMP) WITH MICROSCOPIC
Bilirubin Urine: NEGATIVE
Glucose, UA: NEGATIVE mg/dL
Ketones, ur: NEGATIVE mg/dL
Leukocytes,Ua: NEGATIVE
Nitrite: NEGATIVE
Protein, ur: 30 mg/dL — AB
Specific Gravity, Urine: 1.015 (ref 1.005–1.030)
Squamous Epithelial / HPF: NONE SEEN (ref 0–5)
pH: 5.5 (ref 5.0–8.0)

## 2022-02-26 LAB — CBC
HCT: 29.4 % — ABNORMAL LOW (ref 39.0–52.0)
Hemoglobin: 9.1 g/dL — ABNORMAL LOW (ref 13.0–17.0)
MCH: 24 pg — ABNORMAL LOW (ref 26.0–34.0)
MCHC: 31 g/dL (ref 30.0–36.0)
MCV: 77.6 fL — ABNORMAL LOW (ref 80.0–100.0)
Platelets: 192 10*3/uL (ref 150–400)
RBC: 3.79 MIL/uL — ABNORMAL LOW (ref 4.22–5.81)
RDW: 14 % (ref 11.5–15.5)
WBC: 4.1 10*3/uL (ref 4.0–10.5)
nRBC: 0 % (ref 0.0–0.2)

## 2022-02-26 LAB — PROTIME-INR
INR: 1.1 (ref 0.8–1.2)
Prothrombin Time: 14.2 seconds (ref 11.4–15.2)

## 2022-02-27 DIAGNOSIS — Z4502 Encounter for adjustment and management of automatic implantable cardiac defibrillator: Secondary | ICD-10-CM | POA: Diagnosis not present

## 2022-03-04 ENCOUNTER — Other Ambulatory Visit
Admission: RE | Admit: 2022-03-04 | Discharge: 2022-03-04 | Disposition: A | Discharge status: Home / Self Care | Payer: Medicare Other | Attending: Cardiovascular Disease | Admitting: Cardiovascular Disease

## 2022-03-04 DIAGNOSIS — I509 Heart failure, unspecified: Secondary | ICD-10-CM | POA: Diagnosis not present

## 2022-03-04 DIAGNOSIS — Z95811 Presence of heart assist device: Secondary | ICD-10-CM | POA: Insufficient documentation

## 2022-03-04 LAB — COMPREHENSIVE METABOLIC PANEL
ALT: 12 U/L (ref 0–44)
AST: 23 U/L (ref 15–41)
Albumin: 3.7 g/dL (ref 3.5–5.0)
Alkaline Phosphatase: 39 U/L (ref 38–126)
Anion gap: 6 (ref 5–15)
BUN: 32 mg/dL — ABNORMAL HIGH (ref 8–23)
CO2: 24 mmol/L (ref 22–32)
Calcium: 8.8 mg/dL — ABNORMAL LOW (ref 8.9–10.3)
Chloride: 103 mmol/L (ref 98–111)
Creatinine, Ser: 2.69 mg/dL — ABNORMAL HIGH (ref 0.61–1.24)
GFR, Estimated: 24 mL/min — ABNORMAL LOW (ref >60)
Glucose, Bld: 129 mg/dL — ABNORMAL HIGH (ref 70–99)
Potassium: 4.3 mmol/L (ref 3.5–5.1)
Sodium: 133 mmol/L — ABNORMAL LOW (ref 135–145)
Total Bilirubin: 1.3 mg/dL — ABNORMAL HIGH (ref 0.3–1.2)
Total Protein: 7.1 g/dL (ref 6.5–8.1)

## 2022-03-04 LAB — CBC
HCT: 29.9 % — ABNORMAL LOW (ref 39.0–52.0)
Hemoglobin: 9.1 g/dL — ABNORMAL LOW (ref 13.0–17.0)
MCH: 23.8 pg — ABNORMAL LOW (ref 26.0–34.0)
MCHC: 30.4 g/dL (ref 30.0–36.0)
MCV: 78.3 fL — ABNORMAL LOW (ref 80.0–100.0)
Platelets: 196 10*3/uL (ref 150–400)
RBC: 3.82 MIL/uL — ABNORMAL LOW (ref 4.22–5.81)
RDW: 14.1 % (ref 11.5–15.5)
WBC: 3.9 10*3/uL — ABNORMAL LOW (ref 4.0–10.5)
nRBC: 0 % (ref 0.0–0.2)

## 2022-03-04 LAB — PROTIME-INR
INR: 1.1 (ref 0.8–1.2)
Prothrombin Time: 13.8 seconds (ref 11.4–15.2)

## 2022-03-05 DIAGNOSIS — Z95811 Presence of heart assist device: Secondary | ICD-10-CM | POA: Diagnosis not present

## 2022-03-05 DIAGNOSIS — I428 Other cardiomyopathies: Secondary | ICD-10-CM | POA: Diagnosis not present

## 2022-03-05 DIAGNOSIS — K922 Gastrointestinal hemorrhage, unspecified: Secondary | ICD-10-CM | POA: Diagnosis not present

## 2022-03-05 DIAGNOSIS — I499 Cardiac arrhythmia, unspecified: Secondary | ICD-10-CM | POA: Diagnosis not present

## 2022-03-05 DIAGNOSIS — E039 Hypothyroidism, unspecified: Secondary | ICD-10-CM | POA: Diagnosis not present

## 2022-03-05 DIAGNOSIS — N1832 Chronic kidney disease, stage 3b: Secondary | ICD-10-CM | POA: Diagnosis not present

## 2022-03-05 DIAGNOSIS — R42 Dizziness and giddiness: Secondary | ICD-10-CM | POA: Diagnosis not present

## 2022-03-05 DIAGNOSIS — E785 Hyperlipidemia, unspecified: Secondary | ICD-10-CM | POA: Diagnosis not present

## 2022-03-05 DIAGNOSIS — I130 Hypertensive heart and chronic kidney disease with heart failure and stage 1 through stage 4 chronic kidney disease, or unspecified chronic kidney disease: Secondary | ICD-10-CM | POA: Diagnosis not present

## 2022-03-05 DIAGNOSIS — R319 Hematuria, unspecified: Secondary | ICD-10-CM | POA: Diagnosis not present

## 2022-03-05 DIAGNOSIS — N184 Chronic kidney disease, stage 4 (severe): Secondary | ICD-10-CM | POA: Diagnosis not present

## 2022-03-05 DIAGNOSIS — Z87891 Personal history of nicotine dependence: Secondary | ICD-10-CM | POA: Diagnosis not present

## 2022-03-05 DIAGNOSIS — I5042 Chronic combined systolic (congestive) and diastolic (congestive) heart failure: Secondary | ICD-10-CM | POA: Diagnosis not present

## 2022-03-11 DIAGNOSIS — I5042 Chronic combined systolic (congestive) and diastolic (congestive) heart failure: Secondary | ICD-10-CM | POA: Diagnosis not present

## 2022-03-11 DIAGNOSIS — R310 Gross hematuria: Secondary | ICD-10-CM | POA: Diagnosis not present

## 2022-03-11 DIAGNOSIS — R932 Abnormal findings on diagnostic imaging of liver and biliary tract: Secondary | ICD-10-CM | POA: Diagnosis not present

## 2022-03-11 DIAGNOSIS — R9341 Abnormal radiologic findings on diagnostic imaging of renal pelvis, ureter, or bladder: Secondary | ICD-10-CM | POA: Diagnosis not present

## 2022-03-21 DIAGNOSIS — I5042 Chronic combined systolic (congestive) and diastolic (congestive) heart failure: Secondary | ICD-10-CM | POA: Diagnosis not present

## 2022-03-21 DIAGNOSIS — R42 Dizziness and giddiness: Secondary | ICD-10-CM | POA: Diagnosis not present

## 2022-03-21 DIAGNOSIS — I6523 Occlusion and stenosis of bilateral carotid arteries: Secondary | ICD-10-CM | POA: Diagnosis not present

## 2022-03-27 DIAGNOSIS — N3289 Other specified disorders of bladder: Secondary | ICD-10-CM | POA: Diagnosis not present

## 2022-03-27 DIAGNOSIS — R310 Gross hematuria: Secondary | ICD-10-CM | POA: Diagnosis not present

## 2022-04-19 DIAGNOSIS — I5042 Chronic combined systolic (congestive) and diastolic (congestive) heart failure: Secondary | ICD-10-CM | POA: Diagnosis not present

## 2022-04-19 DIAGNOSIS — I080 Rheumatic disorders of both mitral and aortic valves: Secondary | ICD-10-CM | POA: Diagnosis not present

## 2022-04-19 DIAGNOSIS — Z95811 Presence of heart assist device: Secondary | ICD-10-CM | POA: Diagnosis not present

## 2022-05-17 DIAGNOSIS — G4700 Insomnia, unspecified: Secondary | ICD-10-CM | POA: Diagnosis not present

## 2022-05-17 DIAGNOSIS — N3289 Other specified disorders of bladder: Secondary | ICD-10-CM | POA: Diagnosis not present

## 2022-05-17 DIAGNOSIS — N184 Chronic kidney disease, stage 4 (severe): Secondary | ICD-10-CM | POA: Diagnosis not present

## 2022-05-17 DIAGNOSIS — I480 Paroxysmal atrial fibrillation: Secondary | ICD-10-CM | POA: Diagnosis not present

## 2022-05-17 DIAGNOSIS — I509 Heart failure, unspecified: Secondary | ICD-10-CM | POA: Diagnosis not present

## 2022-05-17 DIAGNOSIS — E039 Hypothyroidism, unspecified: Secondary | ICD-10-CM | POA: Diagnosis not present

## 2022-05-17 DIAGNOSIS — Z01818 Encounter for other preprocedural examination: Secondary | ICD-10-CM | POA: Diagnosis not present

## 2022-05-17 DIAGNOSIS — I5042 Chronic combined systolic (congestive) and diastolic (congestive) heart failure: Secondary | ICD-10-CM | POA: Diagnosis not present

## 2022-05-17 DIAGNOSIS — Z95811 Presence of heart assist device: Secondary | ICD-10-CM | POA: Diagnosis not present

## 2022-05-29 DIAGNOSIS — Z886 Allergy status to analgesic agent status: Secondary | ICD-10-CM | POA: Diagnosis not present

## 2022-05-29 DIAGNOSIS — I428 Other cardiomyopathies: Secondary | ICD-10-CM | POA: Diagnosis not present

## 2022-05-29 DIAGNOSIS — I480 Paroxysmal atrial fibrillation: Secondary | ICD-10-CM | POA: Diagnosis not present

## 2022-05-29 DIAGNOSIS — N3289 Other specified disorders of bladder: Secondary | ICD-10-CM | POA: Diagnosis not present

## 2022-05-29 DIAGNOSIS — N329 Bladder disorder, unspecified: Secondary | ICD-10-CM | POA: Diagnosis not present

## 2022-05-29 DIAGNOSIS — Z950 Presence of cardiac pacemaker: Secondary | ICD-10-CM | POA: Diagnosis not present

## 2022-05-29 DIAGNOSIS — Z87891 Personal history of nicotine dependence: Secondary | ICD-10-CM | POA: Diagnosis not present

## 2022-05-29 DIAGNOSIS — I130 Hypertensive heart and chronic kidney disease with heart failure and stage 1 through stage 4 chronic kidney disease, or unspecified chronic kidney disease: Secondary | ICD-10-CM | POA: Diagnosis not present

## 2022-05-29 DIAGNOSIS — R5383 Other fatigue: Secondary | ICD-10-CM | POA: Diagnosis not present

## 2022-05-29 DIAGNOSIS — R310 Gross hematuria: Secondary | ICD-10-CM | POA: Diagnosis not present

## 2022-05-29 DIAGNOSIS — Z888 Allergy status to other drugs, medicaments and biological substances status: Secondary | ICD-10-CM | POA: Diagnosis not present

## 2022-05-29 DIAGNOSIS — N184 Chronic kidney disease, stage 4 (severe): Secondary | ICD-10-CM | POA: Diagnosis not present

## 2022-05-29 DIAGNOSIS — Z941 Heart transplant status: Secondary | ICD-10-CM | POA: Diagnosis not present

## 2022-05-29 DIAGNOSIS — K219 Gastro-esophageal reflux disease without esophagitis: Secondary | ICD-10-CM | POA: Diagnosis not present

## 2022-05-29 DIAGNOSIS — D649 Anemia, unspecified: Secondary | ICD-10-CM | POA: Diagnosis not present

## 2022-05-29 DIAGNOSIS — Z4502 Encounter for adjustment and management of automatic implantable cardiac defibrillator: Secondary | ICD-10-CM | POA: Diagnosis not present

## 2022-05-30 ENCOUNTER — Other Ambulatory Visit
Admission: RE | Admit: 2022-05-30 | Discharge: 2022-05-30 | Disposition: A | Discharge status: Home / Self Care | Payer: Medicare Other | Attending: Cardiovascular Disease | Admitting: Cardiovascular Disease

## 2022-05-30 DIAGNOSIS — Z95811 Presence of heart assist device: Secondary | ICD-10-CM | POA: Diagnosis not present

## 2022-05-30 DIAGNOSIS — I509 Heart failure, unspecified: Secondary | ICD-10-CM | POA: Diagnosis not present

## 2022-05-30 LAB — COMPREHENSIVE METABOLIC PANEL
ALT: 11 U/L (ref 0–44)
AST: 22 U/L (ref 15–41)
Albumin: 3.8 g/dL (ref 3.5–5.0)
Alkaline Phosphatase: 48 U/L (ref 38–126)
Anion gap: 6 (ref 5–15)
BUN: 39 mg/dL — ABNORMAL HIGH (ref 8–23)
CO2: 24 mmol/L (ref 22–32)
Calcium: 9 mg/dL (ref 8.9–10.3)
Chloride: 102 mmol/L (ref 98–111)
Creatinine, Ser: 2.96 mg/dL — ABNORMAL HIGH (ref 0.61–1.24)
GFR, Estimated: 21 mL/min — ABNORMAL LOW (ref >60)
Glucose, Bld: 118 mg/dL — ABNORMAL HIGH (ref 70–99)
Potassium: 5.2 mmol/L — ABNORMAL HIGH (ref 3.5–5.1)
Sodium: 132 mmol/L — ABNORMAL LOW (ref 135–145)
Total Bilirubin: 1.4 mg/dL — ABNORMAL HIGH (ref 0.3–1.2)
Total Protein: 7.5 g/dL (ref 6.5–8.1)

## 2022-06-07 ENCOUNTER — Other Ambulatory Visit
Admission: RE | Admit: 2022-06-07 | Discharge: 2022-06-07 | Disposition: A | Discharge status: Home / Self Care | Payer: Medicare Other | Attending: Cardiovascular Disease | Admitting: Cardiovascular Disease

## 2022-06-07 DIAGNOSIS — I509 Heart failure, unspecified: Secondary | ICD-10-CM | POA: Insufficient documentation

## 2022-06-07 DIAGNOSIS — Z95811 Presence of heart assist device: Secondary | ICD-10-CM | POA: Diagnosis not present

## 2022-06-07 LAB — CBC
HCT: 29.8 % — ABNORMAL LOW (ref 39.0–52.0)
Hemoglobin: 9.2 g/dL — ABNORMAL LOW (ref 13.0–17.0)
MCH: 24.1 pg — ABNORMAL LOW (ref 26.0–34.0)
MCHC: 30.9 g/dL (ref 30.0–36.0)
MCV: 78 fL — ABNORMAL LOW (ref 80.0–100.0)
Platelets: 155 10*3/uL (ref 150–400)
RBC: 3.82 MIL/uL — ABNORMAL LOW (ref 4.22–5.81)
RDW: 14 % (ref 11.5–15.5)
WBC: 3.7 10*3/uL — ABNORMAL LOW (ref 4.0–10.5)
nRBC: 0 % (ref 0.0–0.2)

## 2022-06-07 LAB — COMPREHENSIVE METABOLIC PANEL
ALT: 10 U/L (ref 0–44)
AST: 18 U/L (ref 15–41)
Albumin: 3.8 g/dL (ref 3.5–5.0)
Alkaline Phosphatase: 44 U/L (ref 38–126)
Anion gap: 5 (ref 5–15)
BUN: 48 mg/dL — ABNORMAL HIGH (ref 8–23)
CO2: 25 mmol/L (ref 22–32)
Calcium: 8.9 mg/dL (ref 8.9–10.3)
Chloride: 106 mmol/L (ref 98–111)
Creatinine, Ser: 3.33 mg/dL — ABNORMAL HIGH (ref 0.61–1.24)
GFR, Estimated: 18 mL/min — ABNORMAL LOW (ref >60)
Glucose, Bld: 104 mg/dL — ABNORMAL HIGH (ref 70–99)
Potassium: 5.1 mmol/L (ref 3.5–5.1)
Sodium: 136 mmol/L (ref 135–145)
Total Bilirubin: 0.8 mg/dL (ref 0.3–1.2)
Total Protein: 7.3 g/dL (ref 6.5–8.1)

## 2022-06-17 ENCOUNTER — Other Ambulatory Visit
Admission: RE | Admit: 2022-06-17 | Discharge: 2022-06-17 | Disposition: A | Discharge status: Home / Self Care | Payer: Medicare Other | Attending: Cardiovascular Disease | Admitting: Cardiovascular Disease

## 2022-06-17 DIAGNOSIS — Z95811 Presence of heart assist device: Secondary | ICD-10-CM | POA: Diagnosis not present

## 2022-06-17 DIAGNOSIS — I509 Heart failure, unspecified: Secondary | ICD-10-CM | POA: Diagnosis not present

## 2022-06-17 LAB — COMPREHENSIVE METABOLIC PANEL
ALT: 10 U/L (ref 0–44)
AST: 18 U/L (ref 15–41)
Albumin: 3.5 g/dL (ref 3.5–5.0)
Alkaline Phosphatase: 44 U/L (ref 38–126)
Anion gap: 5 (ref 5–15)
BUN: 33 mg/dL — ABNORMAL HIGH (ref 8–23)
CO2: 25 mmol/L (ref 22–32)
Calcium: 8.7 mg/dL — ABNORMAL LOW (ref 8.9–10.3)
Chloride: 103 mmol/L (ref 98–111)
Creatinine, Ser: 2.73 mg/dL — ABNORMAL HIGH (ref 0.61–1.24)
GFR, Estimated: 23 mL/min — ABNORMAL LOW (ref >60)
Glucose, Bld: 101 mg/dL — ABNORMAL HIGH (ref 70–99)
Potassium: 4.6 mmol/L (ref 3.5–5.1)
Sodium: 133 mmol/L — ABNORMAL LOW (ref 135–145)
Total Bilirubin: 1.5 mg/dL — ABNORMAL HIGH (ref 0.3–1.2)
Total Protein: 7 g/dL (ref 6.5–8.1)

## 2022-07-11 ENCOUNTER — Other Ambulatory Visit
Admission: RE | Admit: 2022-07-11 | Discharge: 2022-07-11 | Disposition: A | Discharge status: Home / Self Care | Payer: Medicare Other | Attending: Cardiovascular Disease | Admitting: Cardiovascular Disease

## 2022-07-11 DIAGNOSIS — Z95811 Presence of heart assist device: Secondary | ICD-10-CM | POA: Diagnosis not present

## 2022-07-11 DIAGNOSIS — I509 Heart failure, unspecified: Secondary | ICD-10-CM | POA: Diagnosis not present

## 2022-07-11 LAB — COMPREHENSIVE METABOLIC PANEL
ALT: 12 U/L (ref 0–44)
AST: 24 U/L (ref 15–41)
Albumin: 3.9 g/dL (ref 3.5–5.0)
Alkaline Phosphatase: 44 U/L (ref 38–126)
Anion gap: 7 (ref 5–15)
BUN: 40 mg/dL — ABNORMAL HIGH (ref 8–23)
CO2: 24 mmol/L (ref 22–32)
Calcium: 8.9 mg/dL (ref 8.9–10.3)
Chloride: 102 mmol/L (ref 98–111)
Creatinine, Ser: 3.17 mg/dL — ABNORMAL HIGH (ref 0.61–1.24)
GFR, Estimated: 19 mL/min — ABNORMAL LOW (ref >60)
Glucose, Bld: 98 mg/dL (ref 70–99)
Potassium: 4.9 mmol/L (ref 3.5–5.1)
Sodium: 133 mmol/L — ABNORMAL LOW (ref 135–145)
Total Bilirubin: 1.1 mg/dL (ref 0.3–1.2)
Total Protein: 7.4 g/dL (ref 6.5–8.1)

## 2022-07-11 LAB — CBC
HCT: 30.1 % — ABNORMAL LOW (ref 39.0–52.0)
Hemoglobin: 9.3 g/dL — ABNORMAL LOW (ref 13.0–17.0)
MCH: 23.8 pg — ABNORMAL LOW (ref 26.0–34.0)
MCHC: 30.9 g/dL (ref 30.0–36.0)
MCV: 77.2 fL — ABNORMAL LOW (ref 80.0–100.0)
Platelets: 184 10*3/uL (ref 150–400)
RBC: 3.9 MIL/uL — ABNORMAL LOW (ref 4.22–5.81)
RDW: 14.6 % (ref 11.5–15.5)
WBC: 4 10*3/uL (ref 4.0–10.5)
nRBC: 0 % (ref 0.0–0.2)

## 2022-07-20 ENCOUNTER — Ambulatory Visit
Admission: EM | Admit: 2022-07-20 | Discharge: 2022-07-20 | Disposition: A | Discharge status: Home / Self Care | Payer: Medicare Other

## 2022-07-20 ENCOUNTER — Encounter: Payer: Self-pay | Admitting: Emergency Medicine

## 2022-07-20 DIAGNOSIS — U071 COVID-19: Secondary | ICD-10-CM

## 2022-07-20 NOTE — Discharge Instructions (Signed)
You were seen for positive COVID test at home and are being treated for COVID-19.   -Continue symptomatic care: Hydrate as directed by your heart failure provider and eat as tolerated. -I have a very low threshold for you to return for further evaluation.  If you start feeling your symptoms are worsening at any point, seek further evaluation at a local urgent care or [facility] in the urgent care is not available.  Take care, Dr. Marland Kitchen, NP-c

## 2022-07-20 NOTE — ED Provider Notes (Signed)
[facility] - [place], [place]   Name: Ryan Mcdonald DOB: [birth year] MRN: [id number] CSN: [id number] PCP: Sallee Lange, NP  Arrival date and time:  07/20/22 1219  Chief Complaint:  Covid Positive   NOTE: Prior to seeing the patient today, I have reviewed the triage nursing documentation and vital signs. Clinical staff has updated patient's PMH/PSHx, current medication list, and drug allergies/intolerances to ensure comprehensive history available to assist in medical decision making.   History:   HPI: Ryan Mcdonald is a 76 y.o. male who presents today with complaints of positive COVID test at home.  Patient states his symptoms are minimal: Increased fatigue and poor appetite.  He denies shortness of breath, cough, body aches, chills or fever.  No over-the-counter medications taken for symptoms.  Of note, patient has a history of HFrEF with an LVAD implant.   Past Medical History:  Diagnosis Date   Atrial fibrillation ([place])    Chronic kidney disease    GI bleed    Hypertension    Thyroid disease     Past Surgical History:  Procedure Laterality Date   CARDIAC DEFIBRILLATOR PLACEMENT      History reviewed. No pertinent family history.  Social History   Tobacco Use   Smoking status: Never   Smokeless tobacco: Never  Vaping Use   Vaping Use: Never used  Substance Use Topics   Alcohol use: No   Drug use: No    There are no problems to display for this patient.   Home Medications:    Current Meds  Medication Sig   amiodarone (PACERONE) 200 MG tablet Take 200 mg by mouth daily.   digoxin (LANOXIN) 0.125 MG tablet Take by mouth daily.   hydrALAZINE (APRESOLINE) 10 MG tablet Take 10 mg by mouth 3 (three) times daily.   levothyroxine (SYNTHROID) 88 MCG tablet Take 1 tablet by mouth daily.   magnesium oxide (MAG-OX) 400 (240 Mg) MG tablet Take 1 tablet by mouth 2 (two) times daily.   pantoprazole (PROTONIX) 40 MG tablet Take 40 mg  by mouth daily.    Allergies:   Ace inhibitors, Aspirin, Metoprolol, and Warfarin  Review of Systems (ROS): Review of Systems  Constitutional:  Positive for activity change, appetite change and fatigue. Negative for chills and fever.  HENT:  Negative for postnasal drip, sinus pressure, sinus pain and sore throat.   Respiratory:  Negative for cough, shortness of breath and wheezing.   Gastrointestinal:  Negative for abdominal distention and abdominal pain.  Neurological:  Negative for dizziness and headaches.  All other systems reviewed and are negative.    Vital Signs: Today's Vitals   07/20/22 1233 07/20/22 1238  BP:  98/73  Pulse:  74  Resp:  15  Temp:  97.8 F (36.6 C)  TempSrc:  Oral  SpO2:  100%  Weight: 156 lb (70.8 kg)   Height: 6\' 2"  (1.88 m)   PainSc: 0-No pain     Physical Exam: Physical Exam Vitals and nursing note reviewed.  Constitutional:      Appearance: Normal appearance.  Cardiovascular:     Rate and Rhythm: Normal rate and regular rhythm.     Heart sounds: Normal heart sounds.     Comments: Continuous VAD hum Pulmonary:     Effort: Pulmonary effort is normal.     Breath sounds: Normal breath sounds.     Comments: VAD hum radiating to back Skin:    General: Skin is warm and dry.  Neurological:     General: No focal deficit present.     Mental Status: He is alert and oriented to person, place, and time.  Psychiatric:        Mood and Affect: Mood normal.        Behavior: Behavior normal.      Urgent Care Treatments / Results:   LABS: PLEASE NOTE: all labs that were ordered this encounter are listed, however only abnormal results are displayed. Labs Reviewed - No data to display  EKG: -None  RADIOLOGY: No results found.  PROCEDURES: Procedures  MEDICATIONS RECEIVED THIS VISIT: Medications - No data to display  PERTINENT CLINICAL COURSE NOTES/UPDATES:   Initial Impression / Assessment and Plan / Urgent Care Course:  Pertinent  labs & imaging results that were available during my care of the patient were personally reviewed by me and considered in my medical decision making (see lab/imaging section of note for values and interpretations).  Ryan Mcdonald is a 76 y.o. male who presents to [facility] today with complaints of positive COVID test, diagnosed with COVID-19, and treated as such with the directions below. NP and patient reviewed discharge instructions below during visit.   Patient is well appearing overall in clinic today. He does not appear to be in any acute distress. Presenting symptoms (see HPI) and exam as documented above.   I have reviewed the follow up and strict return precautions for any new or worsening symptoms. Patient is aware of symptoms that would be deemed urgent/emergent, and would thus require further evaluation either here or in the emergency department. At the time of discharge, he verbalized understanding and consent with the discharge plan as it was reviewed with him. All questions were fielded by provider and/or clinic staff prior to patient discharge.    Final Clinical Impressions / Urgent Care Diagnoses:   Final diagnoses:  [id number]    New Prescriptions:  [place] Controlled Substance Registry consulted? Not Applicable  No orders of the defined types were placed in this encounter.     Discharge Instructions      You were seen for positive COVID test at home and are being treated for COVID-19.   -Continue symptomatic care: Hydrate as directed by your heart failure provider and eat as tolerated. -I have a very low threshold for you to return for further evaluation.  If you start feeling your symptoms are worsening at any point, seek further evaluation at a local urgent care or [facility] in the urgent care is not available.  Take care, Dr. Marland Kitchen, NP-c      Recommended Follow up Care:  Patient encouraged to follow up with the following provider within the  specified time frame, or sooner as dictated by the severity of his symptoms. As always, he was instructed that for any urgent/emergent care needs, he should seek care either here or in the emergency department for more immediate evaluation.   Gertie Baron, DNP, NP-c   Gertie Baron, NP 07/20/22 1328

## 2022-07-20 NOTE — ED Triage Notes (Signed)
Patient c/o cough and congestion that started 4 days ago.  Patient took covid test yesterday and was positive. Patient denies fevers.

## 2022-08-08 ENCOUNTER — Other Ambulatory Visit
Admission: RE | Admit: 2022-08-08 | Discharge: 2022-08-08 | Disposition: A | Discharge status: Home / Self Care | Payer: Medicare Other | Attending: Cardiovascular Disease | Admitting: Cardiovascular Disease

## 2022-08-08 DIAGNOSIS — I509 Heart failure, unspecified: Secondary | ICD-10-CM | POA: Insufficient documentation

## 2022-08-08 DIAGNOSIS — Z95811 Presence of heart assist device: Secondary | ICD-10-CM | POA: Diagnosis not present

## 2022-08-08 LAB — CBC
HCT: 26.8 % — ABNORMAL LOW (ref 39.0–52.0)
Hemoglobin: 8.2 g/dL — ABNORMAL LOW (ref 13.0–17.0)
MCH: 23.9 pg — ABNORMAL LOW (ref 26.0–34.0)
MCHC: 30.6 g/dL (ref 30.0–36.0)
MCV: 78.1 fL — ABNORMAL LOW (ref 80.0–100.0)
Platelets: 228 10*3/uL (ref 150–400)
RBC: 3.43 MIL/uL — ABNORMAL LOW (ref 4.22–5.81)
RDW: 15.1 % (ref 11.5–15.5)
WBC: 4.4 10*3/uL (ref 4.0–10.5)
nRBC: 0 % (ref 0.0–0.2)

## 2022-08-08 LAB — COMPREHENSIVE METABOLIC PANEL
ALT: 13 U/L (ref 0–44)
AST: 19 U/L (ref 15–41)
Albumin: 3.5 g/dL (ref 3.5–5.0)
Alkaline Phosphatase: 44 U/L (ref 38–126)
Anion gap: 4 — ABNORMAL LOW (ref 5–15)
BUN: 40 mg/dL — ABNORMAL HIGH (ref 8–23)
CO2: 27 mmol/L (ref 22–32)
Calcium: 8.8 mg/dL — ABNORMAL LOW (ref 8.9–10.3)
Chloride: 102 mmol/L (ref 98–111)
Creatinine, Ser: 2.96 mg/dL — ABNORMAL HIGH (ref 0.61–1.24)
GFR, Estimated: 21 mL/min — ABNORMAL LOW (ref >60)
Glucose, Bld: 98 mg/dL (ref 70–99)
Potassium: 5 mmol/L (ref 3.5–5.1)
Sodium: 133 mmol/L — ABNORMAL LOW (ref 135–145)
Total Bilirubin: 0.6 mg/dL (ref 0.3–1.2)
Total Protein: 7.1 g/dL (ref 6.5–8.1)

## 2022-08-12 ENCOUNTER — Other Ambulatory Visit
Admission: RE | Admit: 2022-08-12 | Discharge: 2022-08-12 | Disposition: A | Discharge status: Home / Self Care | Payer: Medicare Other | Attending: Cardiovascular Disease | Admitting: Cardiovascular Disease

## 2022-08-12 DIAGNOSIS — Z95811 Presence of heart assist device: Secondary | ICD-10-CM | POA: Insufficient documentation

## 2022-08-12 DIAGNOSIS — I509 Heart failure, unspecified: Secondary | ICD-10-CM | POA: Diagnosis not present

## 2022-08-12 LAB — CBC
HCT: 27 % — ABNORMAL LOW (ref 39.0–52.0)
Hemoglobin: 8.5 g/dL — ABNORMAL LOW (ref 13.0–17.0)
MCH: 24.2 pg — ABNORMAL LOW (ref 26.0–34.0)
MCHC: 31.5 g/dL (ref 30.0–36.0)
MCV: 76.9 fL — ABNORMAL LOW (ref 80.0–100.0)
Platelets: 198 10*3/uL (ref 150–400)
RBC: 3.51 MIL/uL — ABNORMAL LOW (ref 4.22–5.81)
RDW: 15.2 % (ref 11.5–15.5)
WBC: 4.3 10*3/uL (ref 4.0–10.5)
nRBC: 0 % (ref 0.0–0.2)

## 2022-08-12 LAB — COMPREHENSIVE METABOLIC PANEL
ALT: 12 U/L (ref 0–44)
AST: 24 U/L (ref 15–41)
Albumin: 3.5 g/dL (ref 3.5–5.0)
Alkaline Phosphatase: 47 U/L (ref 38–126)
Anion gap: 4 — ABNORMAL LOW (ref 5–15)
BUN: 43 mg/dL — ABNORMAL HIGH (ref 8–23)
CO2: 25 mmol/L (ref 22–32)
Calcium: 9 mg/dL (ref 8.9–10.3)
Chloride: 106 mmol/L (ref 98–111)
Creatinine, Ser: 3.27 mg/dL — ABNORMAL HIGH (ref 0.61–1.24)
GFR, Estimated: 19 mL/min — ABNORMAL LOW (ref >60)
Glucose, Bld: 104 mg/dL — ABNORMAL HIGH (ref 70–99)
Potassium: 4.5 mmol/L (ref 3.5–5.1)
Sodium: 135 mmol/L (ref 135–145)
Total Bilirubin: 0.9 mg/dL (ref 0.3–1.2)
Total Protein: 7.3 g/dL (ref 6.5–8.1)

## 2022-08-16 DIAGNOSIS — R319 Hematuria, unspecified: Secondary | ICD-10-CM | POA: Diagnosis not present

## 2022-08-16 DIAGNOSIS — I5042 Chronic combined systolic (congestive) and diastolic (congestive) heart failure: Secondary | ICD-10-CM | POA: Diagnosis not present

## 2022-08-16 DIAGNOSIS — I499 Cardiac arrhythmia, unspecified: Secondary | ICD-10-CM | POA: Diagnosis not present

## 2022-08-16 DIAGNOSIS — K922 Gastrointestinal hemorrhage, unspecified: Secondary | ICD-10-CM | POA: Diagnosis not present

## 2022-08-16 DIAGNOSIS — Z95811 Presence of heart assist device: Secondary | ICD-10-CM | POA: Diagnosis not present

## 2022-08-16 DIAGNOSIS — D631 Anemia in chronic kidney disease: Secondary | ICD-10-CM | POA: Diagnosis not present

## 2022-08-16 DIAGNOSIS — E785 Hyperlipidemia, unspecified: Secondary | ICD-10-CM | POA: Diagnosis not present

## 2022-08-16 DIAGNOSIS — I130 Hypertensive heart and chronic kidney disease with heart failure and stage 1 through stage 4 chronic kidney disease, or unspecified chronic kidney disease: Secondary | ICD-10-CM | POA: Diagnosis not present

## 2022-08-16 DIAGNOSIS — N184 Chronic kidney disease, stage 4 (severe): Secondary | ICD-10-CM | POA: Diagnosis not present

## 2022-08-16 DIAGNOSIS — I428 Other cardiomyopathies: Secondary | ICD-10-CM | POA: Diagnosis not present

## 2022-08-16 DIAGNOSIS — G4700 Insomnia, unspecified: Secondary | ICD-10-CM | POA: Diagnosis not present

## 2022-08-16 DIAGNOSIS — E869 Volume depletion, unspecified: Secondary | ICD-10-CM | POA: Diagnosis not present

## 2022-08-16 DIAGNOSIS — Z87891 Personal history of nicotine dependence: Secondary | ICD-10-CM | POA: Diagnosis not present

## 2022-08-16 DIAGNOSIS — E039 Hypothyroidism, unspecified: Secondary | ICD-10-CM | POA: Diagnosis not present

## 2022-08-28 DIAGNOSIS — Z4502 Encounter for adjustment and management of automatic implantable cardiac defibrillator: Secondary | ICD-10-CM | POA: Diagnosis not present

## 2022-08-29 ENCOUNTER — Other Ambulatory Visit
Admission: RE | Admit: 2022-08-29 | Discharge: 2022-08-29 | Disposition: A | Discharge status: Home / Self Care | Payer: Medicare Other | Attending: Cardiovascular Disease | Admitting: Cardiovascular Disease

## 2022-08-29 DIAGNOSIS — I509 Heart failure, unspecified: Secondary | ICD-10-CM | POA: Diagnosis not present

## 2022-08-29 DIAGNOSIS — Z95811 Presence of heart assist device: Secondary | ICD-10-CM | POA: Diagnosis not present

## 2022-08-29 LAB — CBC
HCT: 28.2 % — ABNORMAL LOW (ref 39.0–52.0)
Hemoglobin: 8.7 g/dL — ABNORMAL LOW (ref 13.0–17.0)
MCH: 24 pg — ABNORMAL LOW (ref 26.0–34.0)
MCHC: 30.9 g/dL (ref 30.0–36.0)
MCV: 77.7 fL — ABNORMAL LOW (ref 80.0–100.0)
Platelets: 189 10*3/uL (ref 150–400)
RBC: 3.63 MIL/uL — ABNORMAL LOW (ref 4.22–5.81)
RDW: 15.9 % — ABNORMAL HIGH (ref 11.5–15.5)
WBC: 3.5 10*3/uL — ABNORMAL LOW (ref 4.0–10.5)
nRBC: 0 % (ref 0.0–0.2)

## 2022-08-29 LAB — BASIC METABOLIC PANEL
Anion gap: 5 (ref 5–15)
BUN: 27 mg/dL — ABNORMAL HIGH (ref 8–23)
CO2: 26 mmol/L (ref 22–32)
Calcium: 8.7 mg/dL — ABNORMAL LOW (ref 8.9–10.3)
Chloride: 106 mmol/L (ref 98–111)
Creatinine, Ser: 2.49 mg/dL — ABNORMAL HIGH (ref 0.61–1.24)
GFR, Estimated: 26 mL/min — ABNORMAL LOW (ref >60)
Glucose, Bld: 102 mg/dL — ABNORMAL HIGH (ref 70–99)
Potassium: 4.5 mmol/L (ref 3.5–5.1)
Sodium: 137 mmol/L (ref 135–145)

## 2022-09-17 DIAGNOSIS — H25813 Combined forms of age-related cataract, bilateral: Secondary | ICD-10-CM | POA: Diagnosis not present

## 2022-09-27 DIAGNOSIS — C679 Malignant neoplasm of bladder, unspecified: Secondary | ICD-10-CM | POA: Diagnosis not present

## 2022-09-27 DIAGNOSIS — G4700 Insomnia, unspecified: Secondary | ICD-10-CM | POA: Diagnosis not present

## 2022-09-27 DIAGNOSIS — E039 Hypothyroidism, unspecified: Secondary | ICD-10-CM | POA: Diagnosis not present

## 2022-09-27 DIAGNOSIS — Z87891 Personal history of nicotine dependence: Secondary | ICD-10-CM | POA: Diagnosis not present

## 2022-09-27 DIAGNOSIS — N184 Chronic kidney disease, stage 4 (severe): Secondary | ICD-10-CM | POA: Diagnosis not present

## 2022-09-27 DIAGNOSIS — I130 Hypertensive heart and chronic kidney disease with heart failure and stage 1 through stage 4 chronic kidney disease, or unspecified chronic kidney disease: Secondary | ICD-10-CM | POA: Diagnosis not present

## 2022-09-27 DIAGNOSIS — E785 Hyperlipidemia, unspecified: Secondary | ICD-10-CM | POA: Diagnosis not present

## 2022-09-27 DIAGNOSIS — I428 Other cardiomyopathies: Secondary | ICD-10-CM | POA: Diagnosis not present

## 2022-09-27 DIAGNOSIS — I5084 End stage heart failure: Secondary | ICD-10-CM | POA: Diagnosis not present

## 2022-09-27 DIAGNOSIS — I499 Cardiac arrhythmia, unspecified: Secondary | ICD-10-CM | POA: Diagnosis not present

## 2022-09-27 DIAGNOSIS — R319 Hematuria, unspecified: Secondary | ICD-10-CM | POA: Diagnosis not present

## 2022-09-27 DIAGNOSIS — I4891 Unspecified atrial fibrillation: Secondary | ICD-10-CM | POA: Diagnosis not present

## 2022-09-27 DIAGNOSIS — Z79899 Other long term (current) drug therapy: Secondary | ICD-10-CM | POA: Diagnosis not present

## 2022-09-27 DIAGNOSIS — Z95811 Presence of heart assist device: Secondary | ICD-10-CM | POA: Diagnosis not present

## 2022-09-27 DIAGNOSIS — D631 Anemia in chronic kidney disease: Secondary | ICD-10-CM | POA: Diagnosis not present

## 2022-09-27 DIAGNOSIS — K219 Gastro-esophageal reflux disease without esophagitis: Secondary | ICD-10-CM | POA: Diagnosis not present

## 2022-09-27 DIAGNOSIS — I5042 Chronic combined systolic (congestive) and diastolic (congestive) heart failure: Secondary | ICD-10-CM | POA: Diagnosis not present

## 2022-09-27 DIAGNOSIS — K922 Gastrointestinal hemorrhage, unspecified: Secondary | ICD-10-CM | POA: Diagnosis not present

## 2022-10-02 DIAGNOSIS — D090 Carcinoma in situ of bladder: Secondary | ICD-10-CM | POA: Diagnosis not present

## 2022-10-02 DIAGNOSIS — Z5112 Encounter for antineoplastic immunotherapy: Secondary | ICD-10-CM | POA: Diagnosis not present

## 2022-10-09 DIAGNOSIS — D090 Carcinoma in situ of bladder: Secondary | ICD-10-CM | POA: Diagnosis not present

## 2022-10-16 DIAGNOSIS — D090 Carcinoma in situ of bladder: Secondary | ICD-10-CM | POA: Diagnosis not present

## 2022-10-16 DIAGNOSIS — Z5111 Encounter for antineoplastic chemotherapy: Secondary | ICD-10-CM | POA: Diagnosis not present

## 2022-10-30 DIAGNOSIS — D090 Carcinoma in situ of bladder: Secondary | ICD-10-CM | POA: Diagnosis not present

## 2022-11-06 DIAGNOSIS — D090 Carcinoma in situ of bladder: Secondary | ICD-10-CM | POA: Diagnosis not present

## 2022-11-06 DIAGNOSIS — D631 Anemia in chronic kidney disease: Secondary | ICD-10-CM | POA: Diagnosis not present

## 2022-11-06 DIAGNOSIS — Z95811 Presence of heart assist device: Secondary | ICD-10-CM | POA: Diagnosis not present

## 2022-11-06 DIAGNOSIS — N184 Chronic kidney disease, stage 4 (severe): Secondary | ICD-10-CM | POA: Diagnosis not present

## 2022-11-06 DIAGNOSIS — I5042 Chronic combined systolic (congestive) and diastolic (congestive) heart failure: Secondary | ICD-10-CM | POA: Diagnosis not present

## 2022-11-13 DIAGNOSIS — D090 Carcinoma in situ of bladder: Secondary | ICD-10-CM | POA: Diagnosis not present

## 2022-11-22 DIAGNOSIS — I5084 End stage heart failure: Secondary | ICD-10-CM | POA: Diagnosis not present

## 2022-11-22 DIAGNOSIS — Z87891 Personal history of nicotine dependence: Secondary | ICD-10-CM | POA: Diagnosis not present

## 2022-11-22 DIAGNOSIS — I428 Other cardiomyopathies: Secondary | ICD-10-CM | POA: Diagnosis not present

## 2022-11-22 DIAGNOSIS — R799 Abnormal finding of blood chemistry, unspecified: Secondary | ICD-10-CM | POA: Diagnosis not present

## 2022-11-22 DIAGNOSIS — C679 Malignant neoplasm of bladder, unspecified: Secondary | ICD-10-CM | POA: Diagnosis not present

## 2022-11-22 DIAGNOSIS — E785 Hyperlipidemia, unspecified: Secondary | ICD-10-CM | POA: Diagnosis not present

## 2022-11-22 DIAGNOSIS — Z4502 Encounter for adjustment and management of automatic implantable cardiac defibrillator: Secondary | ICD-10-CM | POA: Diagnosis not present

## 2022-11-22 DIAGNOSIS — I130 Hypertensive heart and chronic kidney disease with heart failure and stage 1 through stage 4 chronic kidney disease, or unspecified chronic kidney disease: Secondary | ICD-10-CM | POA: Diagnosis not present

## 2022-11-22 DIAGNOSIS — G4700 Insomnia, unspecified: Secondary | ICD-10-CM | POA: Diagnosis not present

## 2022-11-22 DIAGNOSIS — I5022 Chronic systolic (congestive) heart failure: Secondary | ICD-10-CM | POA: Diagnosis not present

## 2022-11-22 DIAGNOSIS — I499 Cardiac arrhythmia, unspecified: Secondary | ICD-10-CM | POA: Diagnosis not present

## 2022-11-22 DIAGNOSIS — Z95811 Presence of heart assist device: Secondary | ICD-10-CM | POA: Diagnosis not present

## 2022-11-22 DIAGNOSIS — I5042 Chronic combined systolic (congestive) and diastolic (congestive) heart failure: Secondary | ICD-10-CM | POA: Diagnosis not present

## 2022-11-22 DIAGNOSIS — N184 Chronic kidney disease, stage 4 (severe): Secondary | ICD-10-CM | POA: Diagnosis not present

## 2022-11-22 DIAGNOSIS — E039 Hypothyroidism, unspecified: Secondary | ICD-10-CM | POA: Diagnosis not present

## 2022-11-22 DIAGNOSIS — D631 Anemia in chronic kidney disease: Secondary | ICD-10-CM | POA: Diagnosis not present

## 2022-11-29 DIAGNOSIS — Z95810 Presence of automatic (implantable) cardiac defibrillator: Secondary | ICD-10-CM | POA: Diagnosis not present

## 2022-11-29 DIAGNOSIS — Z4502 Encounter for adjustment and management of automatic implantable cardiac defibrillator: Secondary | ICD-10-CM | POA: Diagnosis not present

## 2023-01-01 DIAGNOSIS — R310 Gross hematuria: Secondary | ICD-10-CM | POA: Diagnosis not present

## 2023-01-01 DIAGNOSIS — D090 Carcinoma in situ of bladder: Secondary | ICD-10-CM | POA: Diagnosis not present
